# Patient Record
Sex: Female | Born: 1970 | Race: White | Hispanic: No | Marital: Married | Smoking: Former smoker
Health system: Southern US, Community
[De-identification: ages and names within clinical notes are randomized; demographics above are authoritative.]

## PROBLEM LIST (undated history)

## (undated) DIAGNOSIS — N952 Postmenopausal atrophic vaginitis: Secondary | ICD-10-CM

## (undated) HISTORY — PX: NO PAST SURGERIES: SHX2092

## (undated) HISTORY — DX: Postmenopausal atrophic vaginitis: N95.2

---

## 2019-08-21 ENCOUNTER — Other Ambulatory Visit: Payer: Self-pay

## 2019-08-21 ENCOUNTER — Other Ambulatory Visit (HOSPITAL_COMMUNITY)
Admission: RE | Admit: 2019-08-21 | Discharge: 2019-08-21 | Disposition: A | Discharge status: Home / Self Care | Payer: BC Managed Care – PPO | Source: Ambulatory Visit | Attending: Obstetrics and Gynecology | Admitting: Obstetrics and Gynecology

## 2019-08-21 ENCOUNTER — Encounter: Payer: Self-pay | Admitting: Obstetrics and Gynecology

## 2019-08-21 ENCOUNTER — Ambulatory Visit (INDEPENDENT_AMBULATORY_CARE_PROVIDER_SITE_OTHER): Payer: BC Managed Care – PPO | Admitting: Obstetrics and Gynecology

## 2019-08-21 VITALS — Ht 68.0 in | Wt 154.0 lb

## 2019-08-21 DIAGNOSIS — T148XXA Other injury of unspecified body region, initial encounter: Secondary | ICD-10-CM

## 2019-08-21 DIAGNOSIS — Z1212 Encounter for screening for malignant neoplasm of rectum: Secondary | ICD-10-CM

## 2019-08-21 DIAGNOSIS — Z130 Encounter for screening for diseases of the blood and blood-forming organs and certain disorders involving the immune mechanism: Secondary | ICD-10-CM

## 2019-08-21 DIAGNOSIS — Z9189 Other specified personal risk factors, not elsewhere classified: Secondary | ICD-10-CM | POA: Insufficient documentation

## 2019-08-21 DIAGNOSIS — Z803 Family history of malignant neoplasm of breast: Secondary | ICD-10-CM

## 2019-08-21 DIAGNOSIS — Z131 Encounter for screening for diabetes mellitus: Secondary | ICD-10-CM

## 2019-08-21 DIAGNOSIS — Z124 Encounter for screening for malignant neoplasm of cervix: Secondary | ICD-10-CM

## 2019-08-21 DIAGNOSIS — L900 Lichen sclerosus et atrophicus: Secondary | ICD-10-CM

## 2019-08-21 DIAGNOSIS — Z1231 Encounter for screening mammogram for malignant neoplasm of breast: Secondary | ICD-10-CM

## 2019-08-21 DIAGNOSIS — Z13220 Encounter for screening for lipoid disorders: Secondary | ICD-10-CM

## 2019-08-21 DIAGNOSIS — Z720 Tobacco use: Secondary | ICD-10-CM

## 2019-08-21 DIAGNOSIS — Z01419 Encounter for gynecological examination (general) (routine) without abnormal findings: Secondary | ICD-10-CM | POA: Diagnosis not present

## 2019-08-21 DIAGNOSIS — Z1211 Encounter for screening for malignant neoplasm of colon: Secondary | ICD-10-CM

## 2019-08-21 DIAGNOSIS — E28319 Asymptomatic premature menopause: Secondary | ICD-10-CM | POA: Insufficient documentation

## 2019-08-21 DIAGNOSIS — Z0000 Encounter for general adult medical examination without abnormal findings: Secondary | ICD-10-CM

## 2019-08-21 DIAGNOSIS — Z1329 Encounter for screening for other suspected endocrine disorder: Secondary | ICD-10-CM

## 2019-08-21 MED ORDER — CLOBETASOL PROPIONATE 0.05 % EX OINT: TOPICAL_OINTMENT | CUTANEOUS | 5 refills | Status: DC

## 2019-08-21 NOTE — Progress Notes (Signed)
Gynecology Annual Exam  PCP: Patient, No Pcp Per  Chief Complaint:  Chief Complaint  Patient presents with  . Gynecologic Exam    Vaginal irritation/itching    History of Present Illness:Patient is a 48 y.o. No obstetric history on file. presents for annual exam. The patient has no complaints today.   LMP: No LMP recorded. Patient is postmenopausal. Menarche:10 No periods since 2004 (33) Believes she went through menopause less than 43. Reports really hot flashes.  The patient is sexually active. She denies dyspareunia.  The patient does perform self breast exams.  There is notable family history of breast or ovarian cancer in her family.  The patient wears seatbelts: yes.   The patient has regular exercise: yes, uses a treadmill and eliptical.   The patient denies current symptoms of depression.     Reports she has had 3 fractures recently. One from kicking a chair. Two from leaning over her car middle [employer] and breaking her ribs.   She reports that 2-3 months ago she noticed that she lost pigmentation of her vulva. She has serious itching and irritation. She rubs the area ad sometimes scratches it until it bleeds.   Hx of 3 vaginal deliveries in 2003, 1990 and 1994.  No prior history of fibroids, polyps, or ovarian cysts. Denies a history of STIs. Her husband had a vasectomy.   She believes she has abnormal pap smears before with normal colposcopies.   Currently smoking 6-7 cigarettes a day. Has smoked since 12.    Review of Systems: Review of Systems  Constitutional: Negative for chills, fever, malaise/fatigue and weight loss.  HENT: Negative for congestion, hearing loss and sinus pain.   Eyes: Negative for blurred vision and double vision.  Respiratory: Negative for cough, sputum production, shortness of breath and wheezing.   Cardiovascular: Negative for chest pain, palpitations, orthopnea and leg swelling.  Gastrointestinal: Negative for abdominal pain,  constipation, diarrhea, nausea and vomiting.  Genitourinary: Negative for dysuria, flank pain, frequency, hematuria and urgency.  Musculoskeletal: Negative for back pain, falls and joint pain.  Skin: Positive for itching. Negative for rash.  Neurological: Negative for dizziness and headaches.  Psychiatric/Behavioral: Negative for depression, substance abuse and suicidal ideas. The patient is not nervous/anxious.     Past Medical History:  History reviewed. No pertinent past medical history.  Past Surgical History:  History reviewed. No pertinent surgical history.  Gynecologic History:  No LMP recorded. Patient is postmenopausal. Last Pap: Results were: more than 10 years ago  Last mammogram: in 30's, unknown result  Obstetric History: No obstetric history on file.  Family History:  History reviewed. No pertinent family history.  Social History:  Social History   Socioeconomic History  . Marital status: Married    Spouse name: Not on file  . Number of children: Not on file  . Years of education: Not on file  . Highest education level: Not on file  Occupational History  . Not on file  Social Needs  . Financial resource strain: Not on file  . Food insecurity    Worry: Not on file    Inability: Not on file  . Transportation needs    Medical: Not on file    Non-medical: Not on file  Tobacco Use  . Smoking status: Current Every Day Smoker  . Smokeless tobacco: Never Used  Substance and Sexual Activity  . Alcohol use: Not Currently  . Drug use: Never  . Sexual activity: Yes  Birth control/protection: Post-menopausal  Lifestyle  . Physical activity    Days per week: Not on file    Minutes per session: Not on file  . Stress: Not on file  Relationships  . Social Herbalist on phone: Not on file    Gets together: Not on file    Attends religious service: Not on file    Active member of club or organization: Not on file    Attends meetings of clubs or  organizations: Not on file    Relationship status: Not on file  . Intimate partner violence    Fear of current or ex partner: Not on file    Emotionally abused: Not on file    Physically abused: Not on file    Forced sexual activity: Not on file  Other Topics Concern  . Not on file  Social History Narrative  . Not on file    Allergies:  No Known Allergies  Medications: Prior to Admission medications   Medication Sig Start Date End Date Taking? Authorizing Provider  clobetasol ointment (TEMOVATE) 0.05 % Apply to affected area every night for 4 weeks, then every other day for 4 weeks and then twice a week for 4 weeks or until resolution. 08/21/19   Homero Fellers, MD    Physical Exam Vitals: Height 5\' 8"  (1.727 m), weight 154 lb (69.9 kg).  General: NAD HEENT: normocephalic, anicteric Thyroid: no enlargement, no palpable nodules Pulmonary: No increased work of breathing, CTAB Cardiovascular: RRR, distal pulses 2+ Breast: Breast symmetrical, no tenderness, no palpable nodules or masses, no skin or nipple retraction present, no nipple discharge.  No axillary or supraclavicular lymphadenopathy. Abdomen: NABS, soft, non-tender, non-distended.  Umbilicus without lesions.  No hepatomegaly, splenomegaly or masses palpable. No evidence of hernia  Genitourinary:  External: Normal external female genitalia.  Normal urethral meatus, normal Bartholin's and Skene's glands.   LOSS OF PIGMENTATION- extending from the clitoral hood to around the anus. Excoriations present.   Vagina: Normal vaginal mucosa, no evidence of prolapse.    Cervix: Grossly normal in appearance, no bleeding  Uterus: Non-enlarged, mobile, normal contour.  No CMT  Adnexa: ovaries non-enlarged, no adnexal masses  Rectal: deferred  Lymphatic: no evidence of inguinal lymphadenopathy Extremities: no edema, erythema, or tenderness Neurologic: Grossly intact Psychiatric: mood appropriate, affect full  Physical Exam   Genitourinary:       Genitourinary Comments: Extensive loss of pigmentation in outlined area. Excoriations present    Female chaperone present for pelvic and breast  portions of the physical exam  VULVAR BIOPSY NOTE The indications for vulvar biopsy (rule out neoplasia, establish lichen sclerosus diagnosis) were reviewed.   Risks of the biopsy including pain, bleeding, infection, inadequate specimen, scarring and need for additional procedures  were discussed. The patient stated understanding and agreed to undergo procedure today. Consent was signed,  time out performed.   The patient's vulva was prepped with Betadine. 1% lidocaine was injected into area of concern. A 3 -mm punch biopsy was done, biopsy tissue was picked up with sterile forceps and sterile scissors were used to excise the lesion.  Small bleeding was noted and hemostasis was achieved using silver nitrate sticks.  The patient tolerated the procedure well. Post-procedure instructions  (pelvic rest for one week) were given to the patient. The patient is to call with heavy bleeding, fever greater than 100.4, foul smelling vaginal discharge or other concerns.     Assessment: 48 y.o. No obstetric history on  file. routine annual exam  Plan: Problem List Items Addressed This Visit    None    Visit Diagnoses    Health care maintenance    -  Primary   Cervical cancer screening       Relevant Orders   Cytology - PAP   Encounter for colorectal cancer screening       Relevant Orders   Cologuard   Breast cancer screening by mammogram       Relevant Orders   Mammogram Screening Routine   Lichen sclerosus       Relevant Medications   clobetasol ointment (TEMOVATE) 0.05 %   Other Relevant Orders   Surgical pathology   Fracture of bone       Relevant Orders   DG Bone Density   Screening cholesterol level       Relevant Orders   Lipid panel   Thyroid disorder screen       Relevant Orders   TSH + free T4   Screening for iron  deficiency anemia       Relevant Orders   CBC   Screening for diabetes mellitus       Relevant Orders   Comprehensive metabolic panel      1) Mammogram - recommend yearly screening mammogram.  Mammogram Was ordered today  2) STI screening  was not offered and therefore not obtained  3) ASCCP guidelines and rational discussed.  Patient opts for every 5 years screening interval  4) Osteoporosis  - per USPTF routine screening DEXA at age 50 - FRAX 10 year major fracture risk 21,  10 year hip fracture risk 3.2  YES: maternal hip fracture(x2), previous fragility fracture(x3), smoker, premature menopause  Consider FDA-approved medical therapies in postmenopausal women and men aged 50 years and older, based on the following: a) A hip or vertebral (clinical or morphometric) fracture b) T-score ? -2.5 at the femoral neck or spine after appropriate evaluation to exclude secondary causes C) Low bone mass (T-score between -1.0 and -2.5 at the femoral neck or spine) and a 10-year probability of a hip fracture ? 3% or a 10-year probability of a major osteoporosis-related fracture ? 20% based on the [place]-adapted WHO algorithm   Referred for DEXA scan based of of this result of a fragility fracture . Reviewed Vitamin D and calcium supplementation.   5) Routine healthcare maintenance including cholesterol, diabetes screening discussed To return fasting at a later date  6) Colonoscopy declined, Cologuard accepted..  Screening recommended starting at age 50 for average risk individuals, age 50 for individuals deemed at increased risk (including African Americans) and recommended to continue until age 50.  For patient age 50-85 individualized approach is recommended.  Gold standard screening is via colonoscopy, Cologuard screening is an acceptable alternative for patient unwilling or unable to undergo colonoscopy.  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 Biopsy performed to verify this diagnosis. Will treat with clobetasol and follow up in 6 weeks. Discussed soak and seal treatments twice a day and antihistamine before bed time. Discussed itch scratch cycle and encourage patient to stop vulvar itching.   9) Return in about 6 weeks (around 10/02/2019) for fasting labs ASAP, 6 weeks GYN visit.   Fragility fractures- dexa scan ordered    Jerene Pitch MD [facility], [facility] 08/21/2019 5:24 PM

## 2019-08-21 NOTE — Patient Instructions (Signed)
Institute of [place] for Calcium and Vitamin D  Age (yr) Calcium Recommended Dietary Allowance (mg/day) Vitamin D Recommended Dietary Allowance (international units/day)  9-18 1,300 600  19-50 1,000 600  51-70 1,200 600  71 and older 1,200 800  Data from Institute of Medicine. Dietary reference intakes: calcium, vitamin D. [place], [place]: [employer]; 2011.     Bone Health Bones protect organs, store calcium, anchor muscles, and support the whole body. Keeping your bones strong is important, especially as you get older. You can take actions to help keep your bones strong and healthy. Why is keeping my bones healthy important?  Keeping your bones healthy is important because your body constantly replaces bone cells. Cells get old, and new cells take their place. As we age, we lose bone cells because the body may not be able to make enough new cells to replace the old cells. The amount of bone cells and bone tissue you have is referred to as bone mass. The higher your bone mass, the stronger your bones. The aging process leads to an overall loss of bone mass in the body, which can increase the likelihood of:  Joint pain and stiffness.  Broken bones.  A condition in which the bones become weak and brittle (osteoporosis). A large decline in bone mass occurs in older adults. In women, it occurs about the time of menopause. What actions can I take to keep my bones healthy? Good health habits are important for maintaining healthy bones. This includes eating nutritious foods and exercising regularly. To have healthy bones, you need to get enough of the right minerals and vitamins. Most nutrition experts recommend getting these nutrients from the foods that you eat. In some cases, taking supplements may also be recommended. Doing certain types of exercise is also important for bone health. What are the nutritional recommendations for healthy  bones?  Eating a well-balanced diet with plenty of calcium and vitamin D will help to protect your bones. Nutritional recommendations vary from person to person. Ask your health care provider what is healthy for you. Here are some general guidelines. Get enough calcium Calcium is the most important (essential) mineral for bone health. Most people can get enough calcium from their diet, but supplements may be recommended for people who are at risk for osteoporosis. Good sources of calcium include:  Dairy products, such as low-fat or nonfat milk, cheese, and yogurt.  Dark green leafy vegetables, such as bok choy and broccoli.  Calcium-fortified foods, such as orange juice, cereal, bread, soy beverages, and tofu products.  Nuts, such as almonds. Follow these recommended amounts for daily calcium intake:  Children, age 14-3: 700 mg.  Children, age 30-8: 1,000 mg.  Children, age 53-13: 1,300 mg.  Teens, age 63-18: 1,300 mg.  Adults, age 20-50: 1,000 mg.  Adults, age 61-70: ? Men: 1,000 mg. ? Women: 1,200 mg.  Adults, age 23 or older: 1,200 mg.  Pregnant and breastfeeding females: ? Teens: 1,300 mg. ? Adults: 1,000 mg. Get enough vitamin D Vitamin D is the most essential vitamin for bone health. It helps the body absorb calcium. Sunlight stimulates the skin to make vitamin D, so be sure to get enough sunlight. If you live in a cold climate or you do not get outside often, your health care provider may recommend that you take vitamin D supplements. Good sources of vitamin D in your diet include:  Egg yolks.  Saltwater fish.  Milk and cereal fortified with  vitamin D. Follow these recommended amounts for daily vitamin D intake:  Children and teens, age 50-18: 600 international units.  Adults, age 50 or younger: 400-800 international units.  Adults, age 50 or older: 800-1,000 international units. Get other important nutrients Other nutrients that are important for bone health  include:  Phosphorus. This mineral is found in meat, poultry, dairy foods, nuts, and legumes. The recommended daily intake for adult men and adult women is 700 mg.  Magnesium. This mineral is found in seeds, nuts, dark green vegetables, and legumes. The recommended daily intake for adult men is 400-420 mg. For adult women, it is 310-320 mg.  Vitamin K. This vitamin is found in green leafy vegetables. The recommended daily intake is 120 mg for adult men and 90 mg for adult women. What type of physical activity is best for building and maintaining healthy bones? Weight-bearing and strength-building activities are important for building and maintaining healthy bones. Weight-bearing activities cause muscles and bones to work against gravity. Strength-building activities increase the strength of the muscles that support bones. Weight-bearing and muscle-building activities include:  Walking and hiking.  Jogging and running.  Dancing.  Gym exercises.  Lifting weights.  Tennis and racquetball.  Climbing stairs.  Aerobics. Adults should get at least 30 minutes of moderate physical activity on most days. Children should get at least 60 minutes of moderate physical activity on most days. Ask your health care provider what type of exercise is best for you. How can I find out if my bone mass is low? Bone mass can be measured with an X-ray test called a bone mineral density (BMD) test. This test is recommended for all women who are age 50 or older. It may also be recommended for:  Men who are age 50 or older.  People who are at risk for osteoporosis because of: ? Having bones that break easily. ? Having a long-term disease that weakens bones, such as kidney disease or rheumatoid arthritis. ? Having menopause earlier than normal. ? Taking medicine that weakens bones, such as steroids, thyroid hormones, or hormone treatment for breast cancer or prostate cancer. ? Smoking. ? Drinking three or  more alcoholic drinks a day. If you find that you have a low bone mass, you may be able to prevent osteoporosis or further bone loss by changing your diet and lifestyle. Where can I find more information? For more information, check out the following websites:  National Osteoporosis Foundation: https://carlson-fletcher.info/www.nof.org/patients  [employer] Institutes of Health: www.bones.http://www.myers.net/nih.gov  International Osteoporosis Foundation: Investment banker, operationalwww.iofbonehealth.org Summary  The aging process leads to an overall loss of bone mass in the body, which can increase the likelihood of broken bones and osteoporosis.  Eating a well-balanced diet with plenty of calcium and vitamin D will help to protect your bones.  Weight-bearing and strength-building activities are also important for building and maintaining strong bones.  Bone mass can be measured with an X-ray test called a bone mineral density (BMD) test. This information is not intended to replace advice given to you by your health care provider. Make sure you discuss any questions you have with your health care provider. Document Released: 11/20/2003 Document Revised: 09/26/2017 Document Reviewed: 09/26/2017 Elsevier Patient Education  2020 [employer]    Steps to Quit Smoking Smoking tobacco is the leading cause of preventable death. It can affect almost every organ in the body. Smoking puts you and those around you at risk for developing many serious chronic diseases. Quitting smoking can be difficult, but  it is one of the best things that you can do for your health. It is never too late to quit. How do I get ready to quit? When you decide to quit smoking, create a plan to help you succeed. Before you quit:  Pick a date to quit. Set a date within the next 2 weeks to give you time to prepare.  Write down the reasons why you are quitting. Keep this list in places where you will see it often.  Tell your family, friends, and co-workers that you are quitting. Support from  your loved ones can make quitting easier.  Talk with your health care provider about your options for quitting smoking.  Find out what treatment options are covered by your health insurance.  Identify people, places, things, and activities that make you want to smoke (triggers). Avoid them. What first steps can I take to quit smoking?  Throw away all cigarettes at home, at work, and in your car.  Throw away smoking accessories, such as Scientist, research (medical).  Clean your car. Make sure to empty the ashtray.  Clean your home, including curtains and carpets. What strategies can I use to quit smoking? Talk with your health care provider about combining strategies, such as taking medicines while you are also receiving in-person counseling. Using these two strategies together makes you more likely to succeed in quitting than if you used either strategy on its own.  If you are pregnant or breastfeeding, talk with your health care provider about finding counseling or other support strategies to quit smoking. Do not take medicine to help you quit smoking unless your health care provider tells you to do so. To quit smoking: Quit right away  Quit smoking completely, instead of gradually reducing how much you smoke over a period of time. Research shows that stopping smoking right away is more successful than gradually quitting.  Attend in-person counseling to help you build problem-solving skills. You are more likely to succeed in quitting if you attend counseling sessions regularly. Even short sessions of 10 minutes can be effective. Take medicine You may take medicines to help you quit smoking. Some medicines require a prescription and some you can purchase over-the-counter. Medicines may have nicotine in them to replace the nicotine in cigarettes. Medicines may:  Help to stop cravings.  Help to relieve withdrawal symptoms. Your health care provider may recommend:  Nicotine patches, gum, or  lozenges.  Nicotine inhalers or sprays.  Non-nicotine medicine that is taken by mouth. Find resources Find resources and support systems that can help you to quit smoking and remain smoke-free after you quit. These resources are most helpful when you use them often. They include:  Online chats with a Social worker.  Telephone quitlines.  Printed Furniture conservator/restorer.  Support groups or group counseling.  Text messaging programs.  Mobile phone apps or applications. Use apps that can help you stick to your quit plan by providing reminders, tips, and encouragement. There are many free apps for mobile devices as well as websites. Examples include Quit Guide from the [employer] and smokefree.gov What things can I do to make it easier to quit?   Reach out to your family and friends for support and encouragement. Call telephone quitlines (1-800-QUIT-NOW), reach out to support groups, or work with a counselor for support.  Ask people who smoke to avoid smoking around you.  Avoid places that trigger you to smoke, such as bars, parties, or smoke-break areas at work.  Spend time with  people who do not smoke.  Lessen the stress in your life. Stress can be a smoking trigger for some people. To lessen stress, try: ? Exercising regularly. ? Doing deep-breathing exercises. ? Doing yoga. ? Meditating. ? Performing a body scan. This involves closing your eyes, scanning your body from head to toe, and noticing which parts of your body are particularly tense. Try to relax the muscles in those areas. How will I feel when I quit smoking? Day 1 to 3 weeks Within the first 24 hours of quitting smoking, you may start to feel withdrawal symptoms. These symptoms are usually most noticeable 2-3 days after quitting, but they usually do not last for more than 2-3 weeks. You may experience these symptoms:  Mood swings.  Restlessness, anxiety, or irritability.  Trouble concentrating.  Dizziness.  Strong cravings  for sugary foods and nicotine.  Mild weight gain.  Constipation.  Nausea.  Coughing or a sore throat.  Changes in how the medicines that you take for unrelated issues work in your body.  Depression.  Trouble sleeping (insomnia). Week 3 and afterward After the first 2-3 weeks of quitting, you may start to notice more positive results, such as:  Improved sense of smell and taste.  Decreased coughing and sore throat.  Slower heart rate.  Lower blood pressure.  Clearer skin.  The ability to breathe more easily.  Fewer sick days. Quitting smoking can be very challenging. Do not get discouraged if you are not successful the first time. Some people need to make many attempts to quit before they achieve long-term success. Do your best to stick to your quit plan, and talk with your health care provider if you have any questions or concerns. Summary  Smoking tobacco is the leading cause of preventable death. Quitting smoking is one of the best things that you can do for your health.  When you decide to quit smoking, create a plan to help you succeed.  Quit smoking right away, not slowly over a period of time.  When you start quitting, seek help from your health care provider, family, or friends. This information is not intended to replace advice given to you by your health care provider. Make sure you discuss any questions you have with your health care provider. Document Released: 08/24/2001 Document Revised: 11/17/2018 Document Reviewed: 11/18/2018 Elsevier Patient Education  2020 [employer].

## 2019-08-22 ENCOUNTER — Other Ambulatory Visit: Payer: Self-pay | Admitting: Obstetrics and Gynecology

## 2019-08-22 ENCOUNTER — Encounter: Payer: Self-pay | Admitting: Obstetrics and Gynecology

## 2019-08-22 DIAGNOSIS — N9089 Other specified noninflammatory disorders of vulva and perineum: Secondary | ICD-10-CM

## 2019-08-22 MED ORDER — TRAMADOL HCL 50 MG PO TABS: 50.0000 mg | ORAL_TABLET | Freq: Four times a day (QID) | ORAL | 0 refills | Status: DC | PRN

## 2019-08-22 NOTE — Progress Notes (Unsigned)
tra

## 2019-08-22 NOTE — Telephone Encounter (Signed)
You can write her a note to be off of work for 1 week maximum. I can sen rx for tramadol Please notify the patient

## 2019-08-23 LAB — CYTOLOGY - PAP
Comment: NEGATIVE
Diagnosis: NEGATIVE
High risk HPV: NEGATIVE

## 2019-08-24 ENCOUNTER — Telehealth: Payer: Self-pay | Admitting: Obstetrics and Gynecology

## 2019-08-24 LAB — SURGICAL PATHOLOGY

## 2019-08-24 NOTE — Telephone Encounter (Signed)
Called and left voicemail about schedule change for 10/03/19. Dr Gilman Schmidt is now gong to be in surgery reschedule appointment to Thursday, 10/04/19.

## 2019-08-29 ENCOUNTER — Other Ambulatory Visit: Payer: BC Managed Care – PPO

## 2019-08-30 NOTE — Telephone Encounter (Signed)
Please schedule patient for a visit tomorrow

## 2019-09-03 ENCOUNTER — Ambulatory Visit: Payer: BC Managed Care – PPO | Admitting: Obstetrics and Gynecology

## 2019-09-27 LAB — COLOGUARD

## 2019-10-03 ENCOUNTER — Ambulatory Visit: Payer: BC Managed Care – PPO | Admitting: Obstetrics and Gynecology

## 2019-10-04 ENCOUNTER — Ambulatory Visit: Payer: BC Managed Care – PPO | Admitting: Obstetrics and Gynecology

## 2019-10-18 NOTE — Telephone Encounter (Signed)
Please call cologuard and request the report.

## 2020-10-30 ENCOUNTER — Emergency Department: Payer: BC Managed Care – PPO

## 2020-10-30 ENCOUNTER — Emergency Department
Admission: EM | Admit: 2020-10-30 | Discharge: 2020-10-30 | Disposition: A | Discharge status: Home / Self Care | Payer: BC Managed Care – PPO | Attending: Emergency Medicine | Admitting: Emergency Medicine

## 2020-10-30 ENCOUNTER — Other Ambulatory Visit: Payer: Self-pay

## 2020-10-30 DIAGNOSIS — F17210 Nicotine dependence, cigarettes, uncomplicated: Secondary | ICD-10-CM | POA: Insufficient documentation

## 2020-10-30 DIAGNOSIS — R52 Pain, unspecified: Secondary | ICD-10-CM

## 2020-10-30 DIAGNOSIS — D72829 Elevated white blood cell count, unspecified: Secondary | ICD-10-CM | POA: Diagnosis present

## 2020-10-30 DIAGNOSIS — Z20822 Contact with and (suspected) exposure to covid-19: Secondary | ICD-10-CM | POA: Insufficient documentation

## 2020-10-30 DIAGNOSIS — M791 Myalgia, unspecified site: Secondary | ICD-10-CM | POA: Diagnosis not present

## 2020-10-30 DIAGNOSIS — R509 Fever, unspecified: Secondary | ICD-10-CM | POA: Insufficient documentation

## 2020-10-30 DIAGNOSIS — R519 Headache, unspecified: Secondary | ICD-10-CM | POA: Diagnosis not present

## 2020-10-30 LAB — CBC WITH DIFFERENTIAL/PLATELET
Abs Immature Granulocytes: 0.08 10*3/uL — ABNORMAL HIGH (ref 0.00–0.07)
Basophils Absolute: 0.1 10*3/uL (ref 0.0–0.1)
Basophils Relative: 0 %
Eosinophils Absolute: 0.1 10*3/uL (ref 0.0–0.5)
Eosinophils Relative: 1 %
HCT: 40.7 % (ref 36.0–46.0)
Hemoglobin: 13.9 g/dL (ref 12.0–15.0)
Immature Granulocytes: 1 %
Lymphocytes Relative: 18 %
Lymphs Abs: 2.9 10*3/uL (ref 0.7–4.0)
MCH: 30.1 pg (ref 26.0–34.0)
MCHC: 34.2 g/dL (ref 30.0–36.0)
MCV: 88.1 fL (ref 80.0–100.0)
Monocytes Absolute: 1.4 10*3/uL — ABNORMAL HIGH (ref 0.1–1.0)
Monocytes Relative: 9 %
Neutro Abs: 11 10*3/uL — ABNORMAL HIGH (ref 1.7–7.7)
Neutrophils Relative %: 71 %
Platelets: 271 10*3/uL (ref 150–400)
RBC: 4.62 MIL/uL (ref 3.87–5.11)
RDW: 12.6 % (ref 11.5–15.5)
WBC: 15.6 10*3/uL — ABNORMAL HIGH (ref 4.0–10.5)
nRBC: 0 % (ref 0.0–0.2)

## 2020-10-30 LAB — URINALYSIS, COMPLETE (UACMP) WITH MICROSCOPIC
Bilirubin Urine: NEGATIVE
Glucose, UA: NEGATIVE mg/dL
Ketones, ur: 5 mg/dL — AB
Leukocytes,Ua: NEGATIVE
Nitrite: NEGATIVE
Protein, ur: NEGATIVE mg/dL
Specific Gravity, Urine: 1.005 (ref 1.005–1.030)
pH: 5 (ref 5.0–8.0)

## 2020-10-30 LAB — COMPREHENSIVE METABOLIC PANEL
ALT: 19 U/L (ref 0–44)
AST: 21 U/L (ref 15–41)
Albumin: 4.4 g/dL (ref 3.5–5.0)
Alkaline Phosphatase: 94 U/L (ref 38–126)
Anion gap: 11 (ref 5–15)
BUN: 11 mg/dL (ref 6–20)
CO2: 23 mmol/L (ref 22–32)
Calcium: 9.3 mg/dL (ref 8.9–10.3)
Chloride: 101 mmol/L (ref 98–111)
Creatinine, Ser: 0.9 mg/dL (ref 0.44–1.00)
GFR, Estimated: >60 mL/min (ref >60)
Glucose, Bld: 114 mg/dL — ABNORMAL HIGH (ref 70–99)
Potassium: 3.6 mmol/L (ref 3.5–5.1)
Sodium: 135 mmol/L (ref 135–145)
Total Bilirubin: 2.2 mg/dL — ABNORMAL HIGH (ref 0.3–1.2)
Total Protein: 7.8 g/dL (ref 6.5–8.1)

## 2020-10-30 LAB — RESP PANEL BY RT-PCR (FLU A&B, COVID) ARPGX2
Influenza A by PCR: NEGATIVE
Influenza B by PCR: NEGATIVE
SARS Coronavirus 2 by RT PCR: NEGATIVE

## 2020-10-30 LAB — PROTIME-INR
INR: 1 (ref 0.8–1.2)
Prothrombin Time: 13.2 seconds (ref 11.4–15.2)

## 2020-10-30 LAB — LACTIC ACID, PLASMA: Lactic Acid, Venous: 0.8 mmol/L (ref 0.5–1.9)

## 2020-10-30 NOTE — Discharge Instructions (Addendum)
Please seek medical attention for any high fevers, chest pain, shortness of breath, change in behavior, persistent vomiting, bloody stool or any other new or concerning symptoms.  

## 2020-10-30 NOTE — ED Provider Notes (Signed)
[facility] Emergency Department Provider Note   ____________________________________________   I have reviewed the triage vital signs and the nursing notes.   HISTORY  Chief Complaint Weakness, Fatigue, and Headache   History limited by: Not Limited   HPI Michelle Paul is a 50 y.o. female who presents to the emergency department today from [place] clinic because of concerns for elevated white count and blood work.  Patient went to [facility] clinic today because of concerns for fever headache and myalgias.  Patient did take some over-the-counter medication which has helped with her fever and she states her headache feels better.  Blood work was checked to [facility] clinic and she was found to have a white count of 15.  Because of this she was sent to the emergency department.   Records reviewed. Per medical record review patient has a history of leukocytosis on blood work earlier today.  History reviewed. No pertinent past medical history.  Patient Active Problem List   Diagnosis Date Noted  . Tobacco use 08/21/2019  . High risk for hip fracture 08/21/2019  . Premature menopause 08/21/2019  . Family history of breast cancer 08/21/2019    History reviewed. No pertinent surgical history.  Prior to Admission medications   Medication Sig Start Date End Date Taking? Authorizing Provider  clobetasol ointment (TEMOVATE) 0.05 % Apply to affected area every night for 4 weeks, then every other day for 4 weeks and then twice a week for 4 weeks or until resolution. 08/21/19   Schuman, Jaquelyn Bitter, MD  traMADol (ULTRAM) 50 MG tablet Take 1 tablet (50 mg total) by mouth every 6 (six) hours as needed. 08/22/19   Natale Milch, MD    Allergies Patient has no known allergies.  Family History  Problem Relation Age of Onset  . Breast cancer Mother 66  . Kidney cancer Sister 26    Social History Social History   Tobacco Use  . Smoking status: Current  Every Day Smoker    Packs/day: 0.33    Years: 36.00    Pack years: 11.88    Types: Cigarettes    Start date: 24  . Smokeless tobacco: Never Used  Vaping Use  . Vaping Use: Never used  Substance Use Topics  . Alcohol use: Not Currently  . Drug use: Never    Review of Systems Constitutional: Positive for fever.  Eyes: No visual changes. ENT: No sore throat. Cardiovascular: Denies chest pain. Respiratory: Denies shortness of breath. Gastrointestinal: No abdominal pain.  No nausea, no vomiting.  No diarrhea.   Genitourinary: Negative for dysuria. Musculoskeletal: Positive for myalgias.  Skin: Negative for rash. Neurological: Positive for headache. ____________________________________________   PHYSICAL EXAM:  VITAL SIGNS: ED Triage Vitals  Enc Vitals Group     BP 10/30/20 1851 107/67     Pulse Rate 10/30/20 1851 81     Resp 10/30/20 1851 18     Temp 10/30/20 1851 98.8 F (37.1 C)     Temp Source 10/30/20 1851 Oral     SpO2 10/30/20 1851 96 %     Weight 10/30/20 1851 157 lb (71.2 kg)     Height 10/30/20 1851 5\' 8"  (1.727 m)     Head Circumference --      Peak Flow --      Pain Score 10/30/20 1900 4   Constitutional: Alert and oriented.  Eyes: Conjunctivae are normal.  ENT      Head: Normocephalic and atraumatic.  Nose: No congestion/rhinnorhea.      Mouth/Throat: Mucous membranes are moist.      Neck: No stridor. Hematological/Lymphatic/Immunilogical: No cervical lymphadenopathy. Cardiovascular: Normal rate, regular rhythm.  No murmurs, rubs, or gallops.  Respiratory: Normal respiratory effort without tachypnea nor retractions. Breath sounds are clear and equal bilaterally. No wheezes/rales/rhonchi. Gastrointestinal: Soft and non tender. No rebound. No guarding.  Genitourinary: Deferred Musculoskeletal: Normal range of motion in all extremities. No lower extremity edema. Neurologic:  Normal speech and language. No gross focal neurologic deficits are  appreciated.  Skin:  Skin is warm, dry and intact. No rash noted. Psychiatric: Mood and affect are normal. Speech and behavior are normal. Patient exhibits appropriate insight and judgment.  ____________________________________________    LABS (pertinent positives/negatives)  Lactic acid 0.8 CBC wbc 15.6, hgb 13.9, plt 271 CMP wnl except glu 114, t bili 2.2  ____________________________________________   EKG  None  ____________________________________________    RADIOLOGY  CXR Apical scarring otherwise clear lungs  ____________________________________________   PROCEDURES  Procedures  ____________________________________________   INITIAL IMPRESSION / ASSESSMENT AND PLAN / ED COURSE  Pertinent labs & imaging results that were available during my care of the patient were reviewed by me and considered in my medical decision making (see chart for details).   Patient presented to the emergency department today from [facility] clinic because of concerns for elevated white count on blood work.  Patient does state that she had myalgias headache and fever earlier today.  I would certainly have concerns for possible viral illness.  At this time given the patient felt some relief with over-the-counter medications do not feel any further medications are required at this time.  Also discussed with patient that given likely viral illness I do not think antibiotics are warranted at this time.  Will send off COVID.  ____________________________________________   FINAL CLINICAL IMPRESSION(S) / ED DIAGNOSES  Final diagnoses:  Nonintractable headache, unspecified chronicity pattern, unspecified headache type  Fever, unspecified fever cause  Body aches     Note: This dictation was prepared with Dragon dictation. Any transcriptional errors that result from this process are unintentional     Phineas Semen, MD 10/30/20 2242

## 2020-10-30 NOTE — ED Triage Notes (Signed)
Pt states she woke up this am with a HA. Pt states she had fever and chills this afternoon, temp of 101.6. Pt took 600 mg ibuprofen at 1530. Pt temp 98.8 at this time. Pt states she started feeling weak and fatigued today. Pt states she had stomach ache yesterday and now is tender to touch in suprapubic area.

## 2020-10-30 NOTE — ED Notes (Signed)
Pt c/o fever, chills, HA, fatigue

## 2020-10-30 NOTE — ED Notes (Signed)
MD aware only 1 set of blood cultures collected

## 2020-11-01 LAB — URINE CULTURE: Culture: 10000 — AB

## 2020-11-04 LAB — CULTURE, BLOOD (ROUTINE X 2): Culture: NO GROWTH

## 2021-10-02 ENCOUNTER — Ambulatory Visit: Payer: BC Managed Care – PPO | Admitting: Obstetrics and Gynecology

## 2022-09-27 IMAGING — CR DG CHEST 2V
2 series · 2 of 2 positions shown · non-contrast
Comparison: None.

CLINICAL DATA: Fever and chills

EXAM:
CHEST - 2 VIEW

[chest pa]
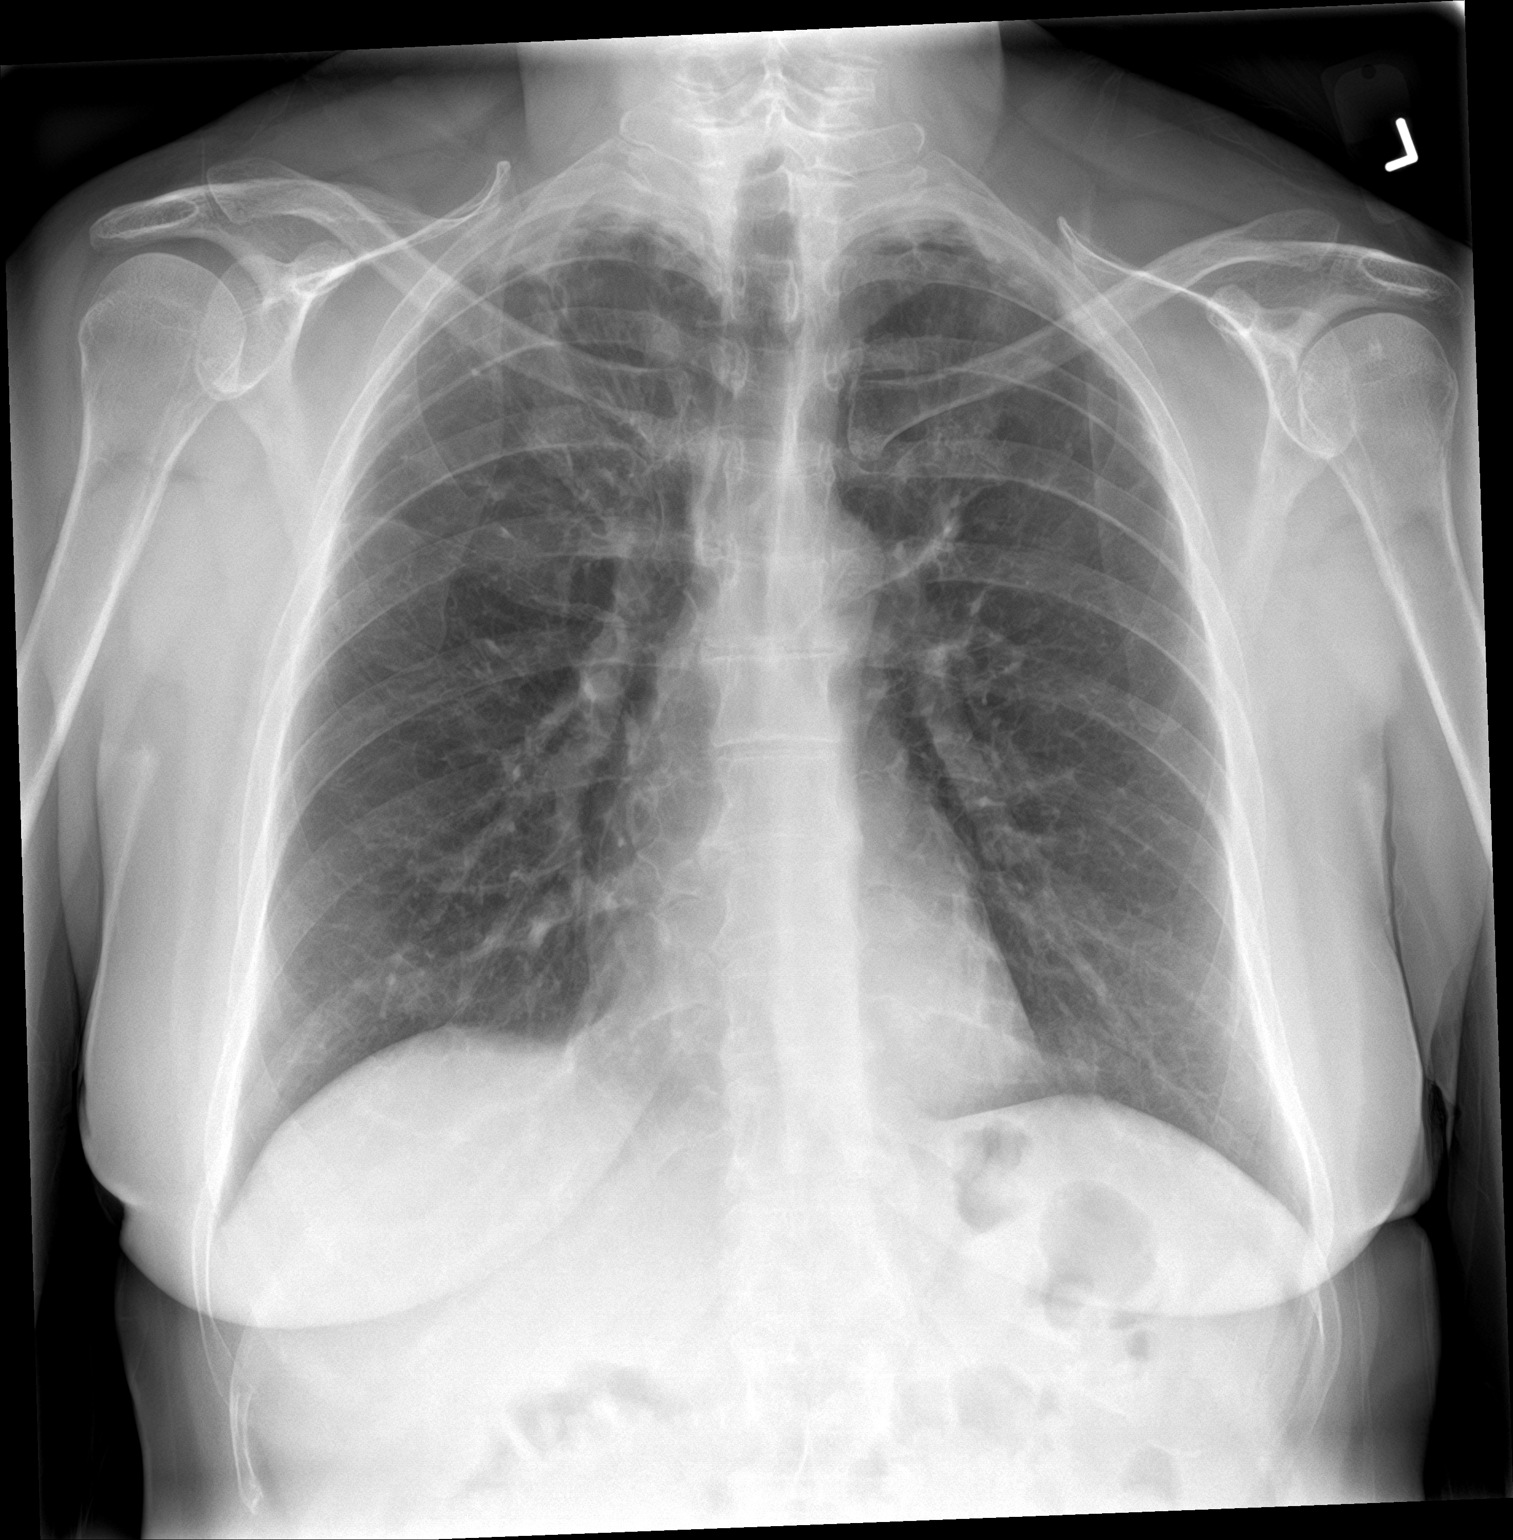

[chest lat]
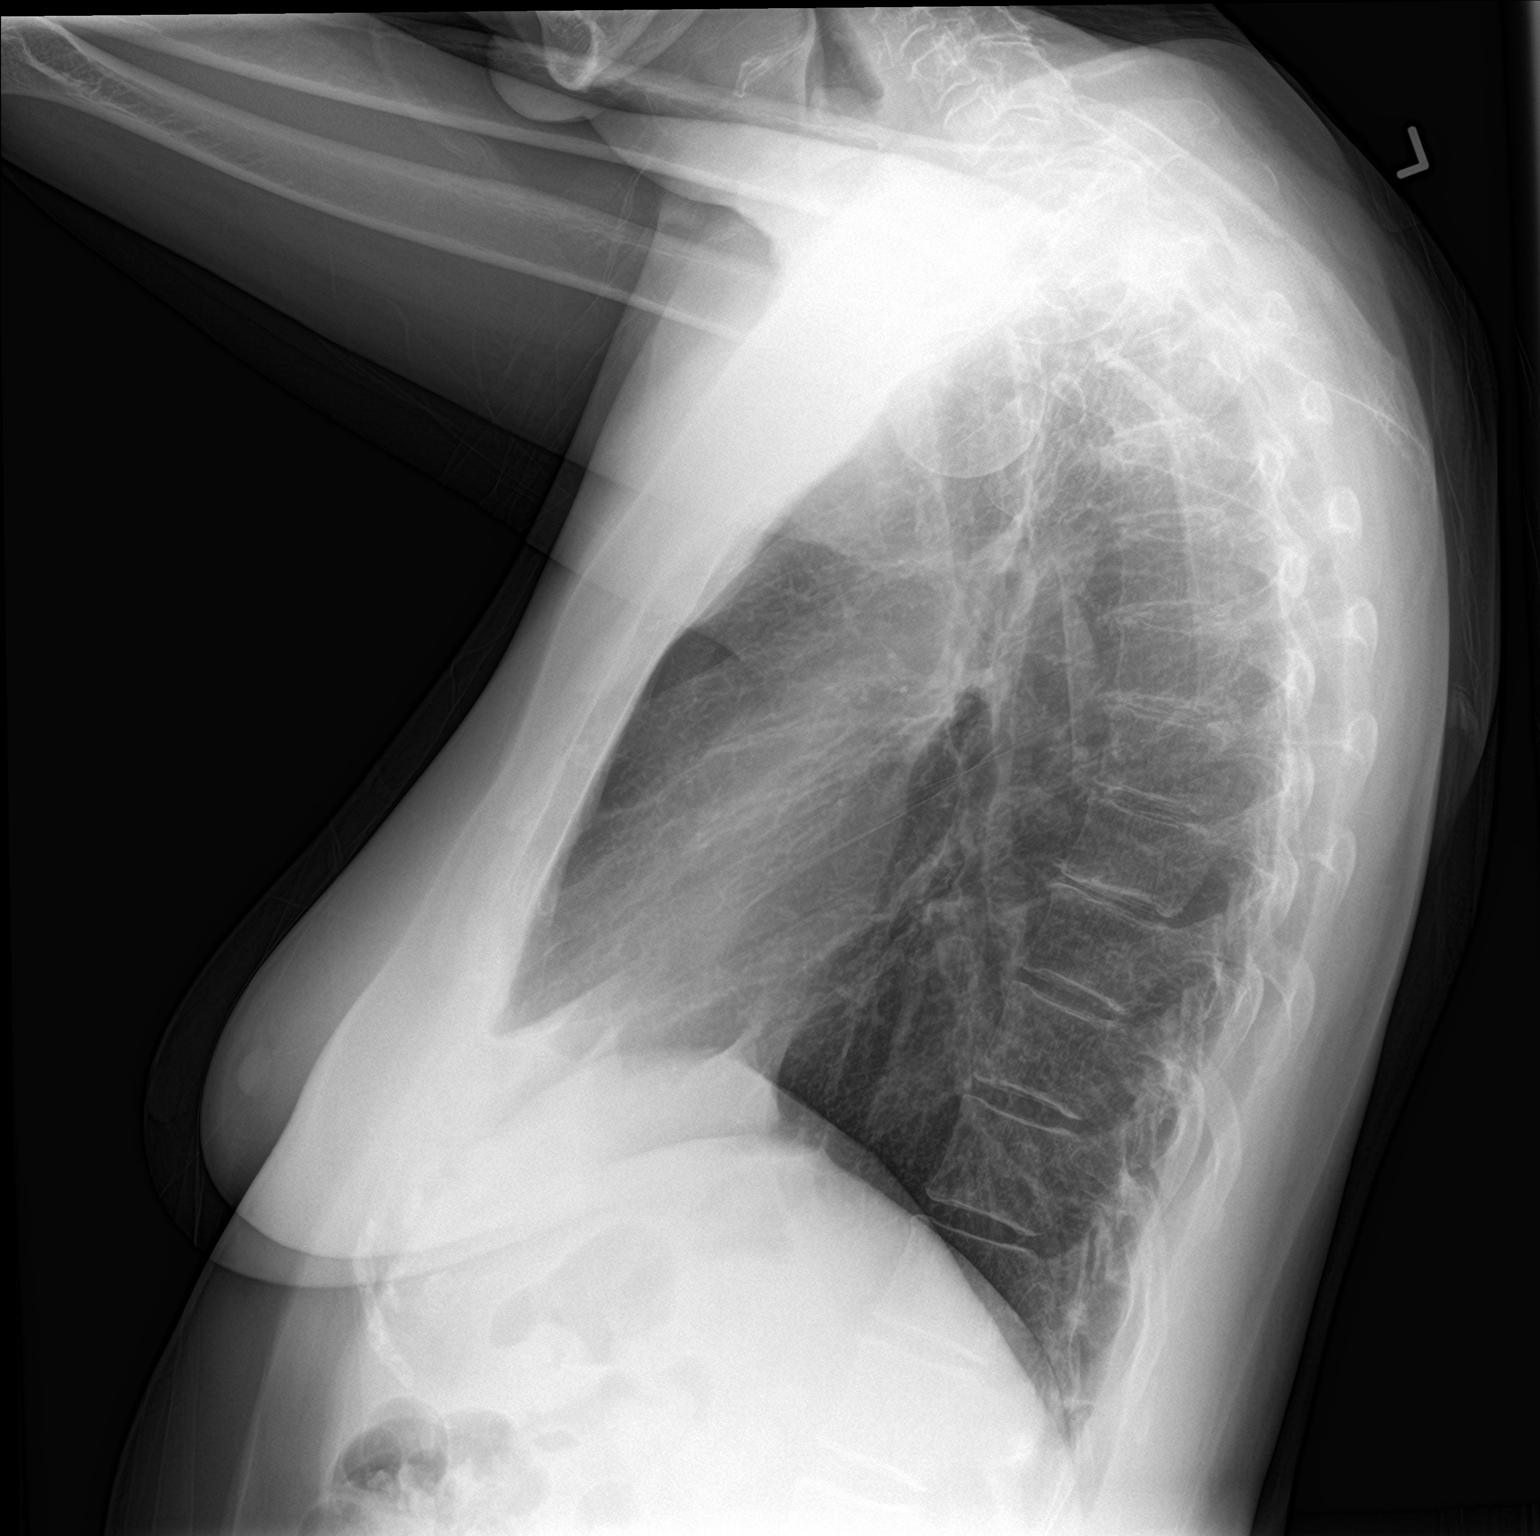

[2 of 2 positions shown; findings below may reference images not displayed]

FINDINGS: There is scarring in the apices. There is no edema or airspace
opacity. Heart size and pulmonary vascularity are normal. No
adenopathy. No bone lesions.
IMPRESSION: Apical scarring bilaterally. Lungs elsewhere clear. Heart size
normal. No evident adenopathy.

## 2023-02-09 ENCOUNTER — Ambulatory Visit: Payer: Managed Care, Other (non HMO) | Admitting: Family Medicine

## 2023-02-09 ENCOUNTER — Encounter: Payer: Self-pay | Admitting: Family Medicine

## 2023-02-09 ENCOUNTER — Other Ambulatory Visit: Payer: Self-pay | Admitting: Family Medicine

## 2023-02-09 VITALS — BP 146/79 | HR 76 | Temp 98.0°F | Ht 66.0 in | Wt 169.2 lb

## 2023-02-09 DIAGNOSIS — N952 Postmenopausal atrophic vaginitis: Secondary | ICD-10-CM

## 2023-02-09 DIAGNOSIS — Z720 Tobacco use: Secondary | ICD-10-CM | POA: Diagnosis not present

## 2023-02-09 DIAGNOSIS — R030 Elevated blood-pressure reading, without diagnosis of hypertension: Secondary | ICD-10-CM | POA: Diagnosis not present

## 2023-02-09 DIAGNOSIS — Z833 Family history of diabetes mellitus: Secondary | ICD-10-CM | POA: Diagnosis not present

## 2023-02-09 DIAGNOSIS — Z1211 Encounter for screening for malignant neoplasm of colon: Secondary | ICD-10-CM

## 2023-02-09 DIAGNOSIS — Z1159 Encounter for screening for other viral diseases: Secondary | ICD-10-CM

## 2023-02-09 DIAGNOSIS — Z8249 Family history of ischemic heart disease and other diseases of the circulatory system: Secondary | ICD-10-CM

## 2023-02-09 DIAGNOSIS — Z1231 Encounter for screening mammogram for malignant neoplasm of breast: Secondary | ICD-10-CM

## 2023-02-09 DIAGNOSIS — Z114 Encounter for screening for human immunodeficiency virus [HIV]: Secondary | ICD-10-CM

## 2023-02-09 DIAGNOSIS — N904 Leukoplakia of vulva: Secondary | ICD-10-CM | POA: Insufficient documentation

## 2023-02-09 LAB — URINALYSIS, ROUTINE W REFLEX MICROSCOPIC
Bilirubin, UA: NEGATIVE
Glucose, UA: NEGATIVE
Ketones, UA: NEGATIVE
Nitrite, UA: NEGATIVE
Protein,UA: NEGATIVE
Specific Gravity, UA: 1.01 (ref 1.005–1.030)
Urobilinogen, Ur: 0.2 mg/dL (ref 0.2–1.0)
pH, UA: 5.5 (ref 5.0–7.5)

## 2023-02-09 LAB — BAYER DCA HB A1C WAIVED: HB A1C (BAYER DCA - WAIVED): 5.6 % (ref 4.8–5.6)

## 2023-02-09 LAB — MICROALBUMIN, URINE WAIVED
Creatinine, Urine Waived: 50 mg/dL (ref 10–300)
Microalb, Ur Waived: 10 mg/L (ref 0–19)

## 2023-02-09 LAB — MICROSCOPIC EXAMINATION: Bacteria, UA: NONE SEEN

## 2023-02-09 MED ORDER — PREMARIN 0.625 MG/GM VA CREA: 1.0000 | TOPICAL_CREAM | Freq: Every day | VAGINAL | 1 refills | Status: DC

## 2023-02-09 NOTE — Assessment & Plan Note (Signed)
Severe. Will start premarin and recheck in a month. If not significantly better, will get her into GYN. Call with any concerns.

## 2023-02-09 NOTE — Assessment & Plan Note (Signed)
Labs drawn today. Await results.  

## 2023-02-09 NOTE — Progress Notes (Signed)
BP (!) 146/79 (BP Location: Left Arm, Cuff Size: Normal)   Pulse 76   Temp 98 F (36.7 C) (Oral)   Ht 5\' 6"  (1.676 m)   Wt 169 lb 3.2 oz (76.7 kg)   SpO2 97%   BMI 27.31 kg/m    Subjective:    Patient ID: Genella Mech, female    DOB: April 27, 1971, 52 y.o.   MRN: [id number]  HPI: ALLORAH WAI is a 52 y.o. female who presents today to establish care  Chief Complaint  Patient presents with   Establish Care   Vaginal Atrophy    Patient says she seen a GYN provider in the past. Patient was told she had cancer and then she had vaginal atrophy and was prescribed a steroid cream to use and says it made the issue worse. Patient says she was not happy with new GYN provider.    Was not happy with her care from her GYN. Has had itching and burning in her vaginal area for about 3 years. She has just been doing vasaline and some sort of vaginal itch cream.  She has not had any bleeding. She notes that she is having pretty severe discomfort with sex. No discharge. She is otherwise doing well with no other concerns or complaints at this time.    Active Ambulatory Problems    Diagnosis Date Noted   Tobacco use 08/21/2019   High risk for hip fracture 08/21/2019   Premature menopause 08/21/2019   Family history of breast cancer 08/21/2019   Vaginal atrophy 02/09/2023   Resolved Ambulatory Problems    Diagnosis Date Noted   No Resolved Ambulatory Problems   No Additional Past Medical History   Past Surgical History:  Procedure Laterality Date   NO PAST SURGERIES     Outpatient Encounter Medications as of 02/09/2023  Medication Sig   conjugated estrogens (PREMARIN) vaginal cream Place 1 Applicatorful vaginally daily. For 2 weeks, then try to decrease to 2-3x a week   [DISCONTINUED] clobetasol ointment (TEMOVATE) 0.05 % Apply to affected area every night for 4 weeks, then every other day for 4 weeks and then twice a week for 4 weeks or until resolution. (Patient not taking: Reported on  02/09/2023)   [DISCONTINUED] traMADol (ULTRAM) 50 MG tablet Take 1 tablet (50 mg total) by mouth every 6 (six) hours as needed. (Patient not taking: Reported on 02/09/2023)   No facility-administered encounter medications on file as of 02/09/2023.   No Known Allergies  Social History   Socioeconomic History   Marital status: Married    Spouse name: Not on file   Number of children: Not on file   Years of education: Not on file   Highest education level: Not on file  Occupational History   Not on file  Tobacco Use   Smoking status: Every Day    Packs/day: 0.33    Years: 36.00    Additional pack years: 0.00    Total pack years: 11.88    Types: Cigarettes    Start date: 1984   Smokeless tobacco: Never  Vaping Use   Vaping Use: Never used  Substance and Sexual Activity   Alcohol use: Not Currently   Drug use: Never   Sexual activity: Yes    Birth control/protection: Post-menopausal  Other Topics Concern   Not on file  Social History Narrative   Not on file   Social Determinants of Health   Financial Resource Strain: Not on file  Food Insecurity:  Not on file  Transportation Needs: Not on file  Physical Activity: Not on file  Stress: Not on file  Social Connections: Not on file   Family History  Problem Relation Age of Onset   Hypertension Mother    Hyperlipidemia Mother    Breast cancer Mother 67   Stroke Mother    Diabetes Father    Hypertension Father    Hyperlipidemia Father    Heart attack Father    Hypertension Sister    Hyperlipidemia Sister    Diabetes Sister    Kidney cancer Sister 9    Review of Systems  Constitutional: Negative.   Respiratory: Negative.    Cardiovascular: Negative.   Gastrointestinal: Negative.   Genitourinary:  Positive for vaginal discharge and vaginal pain. Negative for decreased urine volume, difficulty urinating, dyspareunia, dysuria, enuresis, flank pain, frequency, genital sores, hematuria, menstrual problem, pelvic pain,  urgency and vaginal bleeding.  Musculoskeletal: Negative.   Neurological: Negative.   Psychiatric/Behavioral: Negative.      Per HPI unless specifically indicated above     Objective:    BP (!) 146/79 (BP Location: Left Arm, Cuff Size: Normal)   Pulse 76   Temp 98 F (36.7 C) (Oral)   Ht 5\' 6"  (1.676 m)   Wt 169 lb 3.2 oz (76.7 kg)   SpO2 97%   BMI 27.31 kg/m   Wt Readings from Last 3 Encounters:  02/09/23 169 lb 3.2 oz (76.7 kg)  10/30/20 157 lb (71.2 kg)  08/21/19 154 lb (69.9 kg)    Physical Exam Vitals and nursing note reviewed. Exam conducted with a chaperone present.  Constitutional:      General: She is not in acute distress.    Appearance: Normal appearance. She is not ill-appearing, toxic-appearing or diaphoretic.  HENT:     Head: Normocephalic and atraumatic.     Right Ear: External ear normal.     Left Ear: External ear normal.     Nose: Nose normal.     Mouth/Throat:     Mouth: Mucous membranes are moist.     Pharynx: Oropharynx is clear.  Eyes:     General: No scleral icterus.       Right eye: No discharge.        Left eye: No discharge.     Extraocular Movements: Extraocular movements intact.     Conjunctiva/sclera: Conjunctivae normal.     Pupils: Pupils are equal, round, and reactive to light.  Cardiovascular:     Rate and Rhythm: Normal rate and regular rhythm.     Pulses: Normal pulses.     Heart sounds: Normal heart sounds. No murmur heard.    No friction rub. No gallop.  Pulmonary:     Effort: Pulmonary effort is normal. No respiratory distress.     Breath sounds: Normal breath sounds. No stridor. No wheezing, rhonchi or rales.  Chest:     Chest wall: No tenderness.  Genitourinary:    Comments: Hyperpigmentation around labia and perineum, significant driness and cracking Musculoskeletal:        General: Normal range of motion.     Cervical back: Normal range of motion and neck supple.  Skin:    General: Skin is warm and dry.      Capillary Refill: Capillary refill takes less than 2 seconds.     Coloration: Skin is not jaundiced or pale.     Findings: No bruising, erythema, lesion or rash.  Neurological:     General: No focal  deficit present.     Mental Status: She is alert and oriented to person, place, and time. Mental status is at baseline.  Psychiatric:        Mood and Affect: Mood normal.        Behavior: Behavior normal.        Thought Content: Thought content normal.        Judgment: Judgment normal.     Results for orders placed or performed in visit on 02/09/23  Microscopic Examination   Urine  Result Value Ref Range   WBC, UA 0-5 0 - 5 /hpf   RBC, Urine 0-2 0 - 2 /hpf   Epithelial Cells (non renal) 0-10 0 - 10 /hpf   Bacteria, UA None seen None seen/Few  Urinalysis, Routine w reflex microscopic  Result Value Ref Range   Specific Gravity, UA 1.010 1.005 - 1.030   pH, UA 5.5 5.0 - 7.5   Color, UA Yellow Yellow   Appearance Ur Cloudy (A) Clear   Leukocytes,UA 1+ (A) Negative   Protein,UA Negative Negative/Trace   Glucose, UA Negative Negative   Ketones, UA Negative Negative   RBC, UA Trace (A) Negative   Bilirubin, UA Negative Negative   Urobilinogen, Ur 0.2 0.2 - 1.0 mg/dL   Nitrite, UA Negative Negative   Microscopic Examination See below:   Bayer DCA Hb A1c Waived  Result Value Ref Range   HB A1C (BAYER DCA - WAIVED) 5.6 4.8 - 5.6 %  Microalbumin, Urine Waived  Result Value Ref Range   Microalb, Ur Waived 10 0 - 19 mg/L   Creatinine, Urine Waived 50 10 - 300 mg/dL   Microalb/Creat Ratio 30-300 (H) <30 mg/g      Assessment & Plan:   Problem List Items Addressed This Visit       Genitourinary   Vaginal atrophy - Primary    Severe. Will start premarin and recheck in a month. If not significantly better, will get her into GYN. Call with any concerns.         Other   Tobacco use    Labs drawn today. Await results.       Relevant Orders   Urinalysis, Routine w reflex  microscopic (Completed)   Other Visit Diagnoses     Elevated blood pressure reading       Will check labs and work on [employer]. Recheck 1 month. Call with any concerns.   Relevant Orders   CBC with Differential/Platelet   Comprehensive metabolic panel   TSH   Microalbumin, Urine Waived (Completed)   Family history of diabetes mellitus       Labs drawn today. Await results. Treat as needed.   Relevant Orders   Comprehensive metabolic panel   Bayer DCA Hb W1X Waived (Completed)   Family history of cardiovascular disease       Labs drawn today. Await results. Treat as needed.   Relevant Orders   Lipid Panel w/o Chol/HDL Ratio   Screening for HIV (human immunodeficiency virus)       Labs drawn today. Await results. Treat as needed.   Relevant Orders   HIV Antibody (routine testing w rflx)   Need for hepatitis C screening test       Labs drawn today. Await results. Treat as needed.   Relevant Orders   Hepatitis C Antibody   Encounter for screening mammogram for malignant neoplasm of breast       Mammogram scheduled today.   Relevant Orders  MM 3D SCREENING MAMMOGRAM BILATERAL BREAST   Screening for colon cancer       Cologuard ordered today.   Relevant Orders   Cologuard        Follow up plan: Return in about 4 weeks (around 03/09/2023), or follow up and physical, for records release GYN.

## 2023-02-09 NOTE — Patient Instructions (Signed)
Patient is scheduled for Mammogram on Wednesday June 12th at 9:20 AM at [facility]. If patient has any questions regarding her scheduled appointment. Patient may reach out to their facility directly at [phone].

## 2023-02-10 ENCOUNTER — Encounter: Payer: Self-pay | Admitting: Family Medicine

## 2023-02-10 DIAGNOSIS — E785 Hyperlipidemia, unspecified: Secondary | ICD-10-CM | POA: Insufficient documentation

## 2023-02-10 LAB — CBC WITH DIFFERENTIAL/PLATELET
Basophils Absolute: 0.1 10*3/uL (ref 0.0–0.2)
Basos: 1 %
EOS (ABSOLUTE): 0.1 10*3/uL (ref 0.0–0.4)
Eos: 2 %
Hematocrit: 42 % (ref 34.0–46.6)
Hemoglobin: 14 g/dL (ref 11.1–15.9)
Immature Grans (Abs): 0 10*3/uL (ref 0.0–0.1)
Immature Granulocytes: 1 %
Lymphocytes Absolute: 1.8 10*3/uL (ref 0.7–3.1)
Lymphs: 28 %
MCH: 29.5 pg (ref 26.6–33.0)
MCHC: 33.3 g/dL (ref 31.5–35.7)
MCV: 88 fL (ref 79–97)
Monocytes Absolute: 0.6 10*3/uL (ref 0.1–0.9)
Monocytes: 9 %
Neutrophils Absolute: 3.9 10*3/uL (ref 1.4–7.0)
Neutrophils: 59 %
Platelets: 291 10*3/uL (ref 150–450)
RBC: 4.75 x10E6/uL (ref 3.77–5.28)
RDW: 12.5 % (ref 11.7–15.4)
WBC: 6.5 10*3/uL (ref 3.4–10.8)

## 2023-02-10 LAB — COMPREHENSIVE METABOLIC PANEL
ALT: 26 IU/L (ref 0–32)
AST: 24 IU/L (ref 0–40)
Albumin/Globulin Ratio: 1.7 (ref 1.2–2.2)
Albumin: 4.5 g/dL (ref 3.8–4.9)
Alkaline Phosphatase: 114 IU/L (ref 44–121)
BUN/Creatinine Ratio: 11 (ref 9–23)
BUN: 9 mg/dL (ref 6–24)
Bilirubin Total: 1 mg/dL (ref 0.0–1.2)
CO2: 22 mmol/L (ref 20–29)
Calcium: 9.7 mg/dL (ref 8.7–10.2)
Chloride: 102 mmol/L (ref 96–106)
Creatinine, Ser: 0.84 mg/dL (ref 0.57–1.00)
Globulin, Total: 2.6 g/dL (ref 1.5–4.5)
Glucose: 87 mg/dL (ref 70–99)
Potassium: 3.8 mmol/L (ref 3.5–5.2)
Sodium: 141 mmol/L (ref 134–144)
Total Protein: 7.1 g/dL (ref 6.0–8.5)
eGFR: 83 mL/min/{1.73_m2} (ref >59)

## 2023-02-10 LAB — HEPATITIS C ANTIBODY: Hep C Virus Ab: NONREACTIVE

## 2023-02-10 LAB — LIPID PANEL W/O CHOL/HDL RATIO
Cholesterol, Total: 248 mg/dL — ABNORMAL HIGH (ref 100–199)
HDL: 50 mg/dL (ref >39)
LDL Chol Calc (NIH): 164 mg/dL — ABNORMAL HIGH (ref 0–99)
Triglycerides: 183 mg/dL — ABNORMAL HIGH (ref 0–149)
VLDL Cholesterol Cal: 34 mg/dL (ref 5–40)

## 2023-02-10 LAB — TSH: TSH: 1 u[IU]/mL (ref 0.450–4.500)

## 2023-02-10 LAB — HIV ANTIBODY (ROUTINE TESTING W REFLEX): HIV Screen 4th Generation wRfx: NONREACTIVE

## 2023-02-10 NOTE — Telephone Encounter (Signed)
Requested medication (s) are due for refill today:   Pharmacy comment: Alternative Requested    Requested medication (s) are on the active medication list: yes  Last refill:  02/09/23  Future visit scheduled: yes  Notes to clinic:    Pharmacy comment: Alternative Requested       Requested Prescriptions  Pending Prescriptions Disp Refills   estradiol (ESTRACE) 0.1 MG/GM vaginal cream [Pharmacy Med Name: ESTRADIOL 0.01% CREAM]  0     OB/GYN:  Estrogens Failed - 02/09/2023  1:49 PM      Failed - Mammogram is up-to-date per Health Maintenance      Failed - Last BP in normal range    BP Readings from Last 1 Encounters:  02/09/23 (!) 146/79         Passed - Valid encounter within last 12 months    Recent Outpatient Visits           Yesterday Vaginal atrophy   [facility], Oralia Rud, DO       Future Appointments             In 3 weeks Dorcas Carrow, DO  [facility]

## 2023-02-23 ENCOUNTER — Ambulatory Visit
Admission: RE | Admit: 2023-02-23 | Discharge: 2023-02-23 | Disposition: A | Discharge status: Home / Self Care | Payer: Managed Care, Other (non HMO) | Source: Ambulatory Visit | Attending: Family Medicine | Admitting: Family Medicine

## 2023-02-23 DIAGNOSIS — Z1231 Encounter for screening mammogram for malignant neoplasm of breast: Secondary | ICD-10-CM | POA: Diagnosis not present

## 2023-03-09 ENCOUNTER — Encounter: Payer: Self-pay | Admitting: Family Medicine

## 2023-03-09 ENCOUNTER — Ambulatory Visit: Payer: Managed Care, Other (non HMO) | Admitting: Family Medicine

## 2023-03-09 VITALS — BP 144/88 | HR 76 | Temp 97.6°F | Ht 66.0 in | Wt 167.0 lb

## 2023-03-09 DIAGNOSIS — Z23 Encounter for immunization: Secondary | ICD-10-CM | POA: Diagnosis not present

## 2023-03-09 DIAGNOSIS — Z0000 Encounter for general adult medical examination without abnormal findings: Secondary | ICD-10-CM

## 2023-03-09 MED ORDER — ESTRADIOL 0.1 MG/GM VA CREA: 1.0000 | TOPICAL_CREAM | VAGINAL | 4 refills | Status: DC

## 2023-03-09 NOTE — Progress Notes (Signed)
BP (!) 144/88   Pulse 76   Temp 97.6 F (36.4 C) (Oral)   Ht 5\' 6"  (1.676 m)   Wt 167 lb (75.8 kg)   SpO2 99%   BMI 26.95 kg/m    Subjective:    Patient ID: Michelle Paul, female    DOB: Mar 21, 1971, 52 y.o.   MRN: [id number]  HPI: Michelle Paul is a 52 y.o. female presenting on 03/09/2023 for comprehensive medical examination. Current medical complaints include:  Premarin is working great. Her vaginal opening is feeling much better. No concerns.   Menopausal Symptoms: yes  Depression Screen done today and results listed below:     03/09/2023    8:11 AM 02/09/2023   10:14 AM  Depression screen PHQ 2/9  Decreased Interest 0 0  Down, Depressed, Hopeless 0 0  PHQ - 2 Score 0 0  Altered sleeping 0 0  Tired, decreased energy 0 0  Change in appetite 0 0  Feeling bad or failure about yourself  0 0  Trouble concentrating 0 0  Moving slowly or fidgety/restless 0 0  Suicidal thoughts 0 0  PHQ-9 Score 0 0  Difficult doing work/chores Not difficult at all Not difficult at all    Past Medical History:  Past Medical History:  Diagnosis Date   Vaginal atrophy     Surgical History:  Past Surgical History:  Procedure Laterality Date   NO PAST SURGERIES      Medications:  No current outpatient medications on file prior to visit.   No current facility-administered medications on file prior to visit.    Allergies:  No Known Allergies  Social History:  Social History   Socioeconomic History   Marital status: Married    Spouse name: Not on file   Number of children: Not on file   Years of education: Not on file   Highest education level: Not on file  Occupational History   Not on file  Tobacco Use   Smoking status: Former    Packs/day: 0.33    Years: 36.00    Additional pack years: 0.00    Total pack years: 11.88    Types: Cigarettes    Start date: 1984   Smokeless tobacco: Never  Vaping Use   Vaping Use: Never used  Substance and Sexual Activity   Alcohol  use: Not Currently   Drug use: Never   Sexual activity: Yes    Birth control/protection: Post-menopausal  Other Topics Concern   Not on file  Social History Narrative   Not on file   Social Determinants of Health   Financial Resource Strain: Not on file  Food Insecurity: Not on file  Transportation Needs: Not on file  Physical Activity: Not on file  Stress: Not on file  Social Connections: Not on file  Intimate Partner Violence: Not on file   Social History   Tobacco Use  Smoking Status Former   Packs/day: 0.33   Years: 36.00   Additional pack years: 0.00   Total pack years: 11.88   Types: Cigarettes   Start date: 1984  Smokeless Tobacco Never   Social History   Substance and Sexual Activity  Alcohol Use Not Currently    Family History:  Family History  Problem Relation Age of Onset   Hypertension Mother    Hyperlipidemia Mother    Breast cancer Mother 38   Stroke Mother    Diabetes Father    Hypertension Father    Hyperlipidemia Father  Heart attack Father    Hypertension Sister    Hyperlipidemia Sister    Diabetes Sister    Kidney cancer Sister 71    Past medical history, surgical history, medications, allergies, family history and social history reviewed with patient today and changes made to appropriate areas of the chart.   Review of Systems  Constitutional: Negative.   HENT: Negative.    Eyes: Negative.   Respiratory: Negative.    Cardiovascular: Negative.   Gastrointestinal:  Positive for heartburn. Negative for abdominal pain, blood in stool, constipation, diarrhea, melena, nausea and vomiting.  Genitourinary: Negative.   Musculoskeletal: Negative.   Skin: Negative.   Neurological: Negative.   Endo/Heme/Allergies: Negative.   Psychiatric/Behavioral: Negative.     All other ROS negative except what is listed above and in the HPI.      Objective:    BP (!) 144/88   Pulse 76   Temp 97.6 F (36.4 C) (Oral)   Ht 5\' 6"  (1.676 m)   Wt  167 lb (75.8 kg)   SpO2 99%   BMI 26.95 kg/m   Wt Readings from Last 3 Encounters:  03/09/23 167 lb (75.8 kg)  02/09/23 169 lb 3.2 oz (76.7 kg)  10/30/20 157 lb (71.2 kg)    Physical Exam Vitals and nursing note reviewed.  Constitutional:      General: She is not in acute distress.    Appearance: Normal appearance. She is normal weight. She is not ill-appearing, toxic-appearing or diaphoretic.  HENT:     Head: Normocephalic and atraumatic.     Right Ear: Tympanic membrane, ear canal and external ear normal. There is no impacted cerumen.     Left Ear: Tympanic membrane, ear canal and external ear normal. There is no impacted cerumen.     Nose: Nose normal. No congestion or rhinorrhea.     Mouth/Throat:     Mouth: Mucous membranes are moist.     Pharynx: Oropharynx is clear. No oropharyngeal exudate or posterior oropharyngeal erythema.  Eyes:     General: No scleral icterus.       Right eye: No discharge.        Left eye: No discharge.     Extraocular Movements: Extraocular movements intact.     Conjunctiva/sclera: Conjunctivae normal.     Pupils: Pupils are equal, round, and reactive to light.  Neck:     Vascular: No carotid bruit.  Cardiovascular:     Rate and Rhythm: Normal rate and regular rhythm.     Pulses: Normal pulses.     Heart sounds: No murmur heard.    No friction rub. No gallop.  Pulmonary:     Effort: Pulmonary effort is normal. No respiratory distress.     Breath sounds: Normal breath sounds. No stridor. No wheezing, rhonchi or rales.  Chest:     Chest wall: No tenderness.  Abdominal:     General: Abdomen is flat. Bowel sounds are normal. There is no distension.     Palpations: Abdomen is soft. There is no mass.     Tenderness: There is no abdominal tenderness. There is no right CVA tenderness, left CVA tenderness, guarding or rebound.     Hernia: No hernia is present.  Genitourinary:    Comments: Breast and pelvic exams deferred with shared decision  making Musculoskeletal:        General: No swelling, tenderness, deformity or signs of injury.     Cervical back: Normal range of motion and neck supple. No  rigidity. No muscular tenderness.     Right lower leg: No edema.     Left lower leg: No edema.  Lymphadenopathy:     Cervical: No cervical adenopathy.  Skin:    General: Skin is warm and dry.     Capillary Refill: Capillary refill takes less than 2 seconds.     Coloration: Skin is not jaundiced or pale.     Findings: No bruising, erythema, lesion or rash.  Neurological:     General: No focal deficit present.     Mental Status: She is alert and oriented to person, place, and time. Mental status is at baseline.     Cranial Nerves: No cranial nerve deficit.     Sensory: No sensory deficit.     Motor: No weakness.     Coordination: Coordination normal.     Gait: Gait normal.     Deep Tendon Reflexes: Reflexes normal.  Psychiatric:        Mood and Affect: Mood normal.        Behavior: Behavior normal.        Thought Content: Thought content normal.        Judgment: Judgment normal.     Results for orders placed or performed in visit on 02/09/23  Microscopic Examination   Urine  Result Value Ref Range   WBC, UA 0-5 0 - 5 /hpf   RBC, Urine 0-2 0 - 2 /hpf   Epithelial Cells (non renal) 0-10 0 - 10 /hpf   Bacteria, UA None seen None seen/Few  CBC with Differential/Platelet  Result Value Ref Range   WBC 6.5 3.4 - 10.8 x10E3/uL   RBC 4.75 3.77 - 5.28 x10E6/uL   Hemoglobin 14.0 11.1 - 15.9 g/dL   Hematocrit 95.6 21.3 - 46.6 %   MCV 88 79 - 97 fL   MCH 29.5 26.6 - 33.0 pg   MCHC 33.3 31.5 - 35.7 g/dL   RDW 08.6 57.8 - 46.9 %   Platelets 291 150 - 450 x10E3/uL   Neutrophils 59 Not Estab. %   Lymphs 28 Not Estab. %   Monocytes 9 Not Estab. %   Eos 2 Not Estab. %   Basos 1 Not Estab. %   Neutrophils Absolute 3.9 1.4 - 7.0 x10E3/uL   Lymphocytes Absolute 1.8 0.7 - 3.1 x10E3/uL   Monocytes Absolute 0.6 0.1 - 0.9 x10E3/uL    EOS (ABSOLUTE) 0.1 0.0 - 0.4 x10E3/uL   Basophils Absolute 0.1 0.0 - 0.2 x10E3/uL   Immature Granulocytes 1 Not Estab. %   Immature Grans (Abs) 0.0 0.0 - 0.1 x10E3/uL  Comprehensive metabolic panel  Result Value Ref Range   Glucose 87 70 - 99 mg/dL   BUN 9 6 - 24 mg/dL   Creatinine, Ser 6.29 0.57 - 1.00 mg/dL   eGFR 83 >52 WU/XLK/4.40   BUN/Creatinine Ratio 11 9 - 23   Sodium 141 134 - 144 mmol/L   Potassium 3.8 3.5 - 5.2 mmol/L   Chloride 102 96 - 106 mmol/L   CO2 22 20 - 29 mmol/L   Calcium 9.7 8.7 - 10.2 mg/dL   Total Protein 7.1 6.0 - 8.5 g/dL   Albumin 4.5 3.8 - 4.9 g/dL   Globulin, Total 2.6 1.5 - 4.5 g/dL   Albumin/Globulin Ratio 1.7 1.2 - 2.2   Bilirubin Total 1.0 0.0 - 1.2 mg/dL   Alkaline Phosphatase 114 44 - 121 IU/L   AST 24 0 - 40 IU/L   ALT 26 0 -  32 IU/L  Lipid Panel w/o Chol/HDL Ratio  Result Value Ref Range   Cholesterol, Total 248 (H) 100 - 199 mg/dL   Triglycerides 782 (H) 0 - 149 mg/dL   HDL 50 >95 mg/dL   VLDL Cholesterol Cal 34 5 - 40 mg/dL   LDL Chol Calc (NIH) 621 (H) 0 - 99 mg/dL  Urinalysis, Routine w reflex microscopic  Result Value Ref Range   Specific Gravity, UA 1.010 1.005 - 1.030   pH, UA 5.5 5.0 - 7.5   Color, UA Yellow Yellow   Appearance Ur Cloudy (A) Clear   Leukocytes,UA 1+ (A) Negative   Protein,UA Negative Negative/Trace   Glucose, UA Negative Negative   Ketones, UA Negative Negative   RBC, UA Trace (A) Negative   Bilirubin, UA Negative Negative   Urobilinogen, Ur 0.2 0.2 - 1.0 mg/dL   Nitrite, UA Negative Negative   Microscopic Examination See below:   TSH  Result Value Ref Range   TSH 1.000 0.450 - 4.500 uIU/mL  Bayer DCA Hb A1c Waived  Result Value Ref Range   HB A1C (BAYER DCA - WAIVED) 5.6 4.8 - 5.6 %  HIV Antibody (routine testing w rflx)  Result Value Ref Range   HIV Screen 4th Generation wRfx Non Reactive Non Reactive  Hepatitis C Antibody  Result Value Ref Range   Hep C Virus Ab Non Reactive Non Reactive   Microalbumin, Urine Waived  Result Value Ref Range   Microalb, Ur Waived 10 0 - 19 mg/L   Creatinine, Urine Waived 50 10 - 300 mg/dL   Microalb/Creat Ratio 30-300 (H) <30 mg/g      Assessment & Plan:   Problem List Items Addressed This Visit   None Visit Diagnoses     Routine general medical examination at a health care facility    -  Primary   Vaccines up to date/declined. Screening labs checked last visit. Pap and mammo up to date. Cologuard at home. Continue diet and exercise. Call with any concerns   Need for Tdap vaccination            Follow up plan: Return in about 6 months (around 09/08/2023).   LABORATORY TESTING:  - Pap smear: up to date  IMMUNIZATIONS:   - Tdap: Tetanus vaccination status reviewed: Tdap vaccination indicated and given today. - Influenza: Postponed to flu season - Pneumovax: Not applicable - Prevnar: Not applicable - COVID: Refused - HPV: Not applicable - Shingrix vaccine: Refused  SCREENING: -Mammogram: Up to date  - Colonoscopy: will do cologuard- has it at home   PATIENT COUNSELING:   Advised to take 1 mg of folate supplement per day if capable of pregnancy.   Sexuality: Discussed sexually transmitted diseases, partner selection, use of condoms, avoidance of unintended pregnancy  and contraceptive alternatives.   Advised to avoid cigarette smoking.  I discussed with the patient that most people either abstain from alcohol or drink within safe limits (<=14/week and <=4 drinks/occasion for males, <=7/weeks and <= 3 drinks/occasion for females) and that the risk for alcohol disorders and other health effects rises proportionally with the number of drinks per week and how often a drinker exceeds daily limits.  Discussed cessation/primary prevention of drug use and availability of treatment for abuse.   Diet: Encouraged to adjust caloric intake to maintain  or achieve ideal body weight, to reduce intake of dietary saturated fat and total  fat, to limit sodium intake by avoiding high sodium foods and not adding table  salt, and to maintain adequate dietary potassium and calcium preferably from fresh fruits, vegetables, and low-fat dairy products.    stressed the importance of regular exercise  Injury prevention: Discussed safety belts, safety helmets, smoke detector, smoking near bedding or upholstery.   Dental health: Discussed importance of regular tooth brushing, flossing, and dental visits.    NEXT PREVENTATIVE PHYSICAL DUE IN 1 YEAR. Return in about 6 months (around 09/08/2023).

## 2023-03-23 LAB — COLOGUARD: COLOGUARD: NEGATIVE

## 2023-03-23 NOTE — Progress Notes (Signed)
Contacted via MyChart   Cologuard negative, repeat in 3 years.

## 2023-08-22 ENCOUNTER — Encounter: Payer: Self-pay | Admitting: Family Medicine

## 2023-08-22 NOTE — Telephone Encounter (Signed)
appt

## 2023-08-22 NOTE — Telephone Encounter (Signed)
Attempted to reach patient, LVM to call office back if she wants to be seen prior to her January appointment.  Put in CRM.

## 2023-08-22 NOTE — Telephone Encounter (Signed)
OK to wait until her appt in January unless she'd like to get it done sooner

## 2023-09-02 ENCOUNTER — Telehealth: Payer: Managed Care, Other (non HMO) | Admitting: Physician Assistant

## 2023-09-02 ENCOUNTER — Encounter: Payer: Self-pay | Admitting: Family Medicine

## 2023-09-02 DIAGNOSIS — J208 Acute bronchitis due to other specified organisms: Secondary | ICD-10-CM | POA: Diagnosis not present

## 2023-09-02 MED ORDER — BENZONATATE 100 MG PO CAPS
100.0000 mg | ORAL_CAPSULE | Freq: Three times a day (TID) | ORAL | 0 refills | Status: DC | PRN
Start: 2023-09-02 — End: 2023-10-04

## 2023-09-02 MED ORDER — ALBUTEROL SULFATE HFA 108 (90 BASE) MCG/ACT IN AERS
1.0000 | INHALATION_SPRAY | Freq: Four times a day (QID) | RESPIRATORY_TRACT | 0 refills | Status: DC | PRN
Start: 2023-09-02 — End: 2023-10-04

## 2023-09-02 NOTE — Progress Notes (Signed)
E-Visit for Cough   We are sorry that you are not feeling well.  Here is how we plan to help!  Based on your presentation I believe you most likely have A cough due to a virus.  This is called viral bronchitis and is best treated by rest, plenty of fluids and control of the cough.  You may use Ibuprofen or Tylenol as directed to help your symptoms.     In addition you may use A non-prescription cough medication called Mucinex DM: take 2 tablets every 12 hours. and A prescription cough medication called Tessalon Perles 100mg . You may take 1-2 capsules every 8 hours as needed for your cough.  I have also prescribed Albuterol inhaler Use 1-2 puffs every 6 hours as needed for shortness of breath, chest tightness, and/or wheezing.  From your responses in the eVisit questionnaire you describe inflammation in the upper respiratory tract which is causing a significant cough.  This is commonly called Bronchitis and has four common causes:   Allergies Viral Infections Acid Reflux Bacterial Infection Allergies, viruses and acid reflux are treated by controlling symptoms or eliminating the cause. An example might be a cough caused by taking certain blood pressure medications. You stop the cough by changing the medication. Another example might be a cough caused by acid reflux. Controlling the reflux helps control the cough.  USE OF BRONCHODILATOR ("RESCUE") INHALERS: There is a risk from using your bronchodilator too frequently.  The risk is that over-reliance on a medication which only relaxes the muscles surrounding the breathing tubes can reduce the effectiveness of medications prescribed to reduce swelling and congestion of the tubes themselves.  Although you feel brief relief from the bronchodilator inhaler, your asthma may actually be worsening with the tubes becoming more swollen and filled with mucus.  This can delay other crucial treatments, such as oral steroid medications. If you need to use a  bronchodilator inhaler daily, several times per day, you should discuss this with your provider.  There are probably better treatments that could be used to keep your asthma under control.     HOME CARE Only take medications as instructed by your medical team. Complete the entire course of an antibiotic. Drink plenty of fluids and get plenty of rest. Avoid close contacts especially the very young and the elderly Cover your mouth if you cough or cough into your sleeve. Always remember to wash your hands A steam or ultrasonic humidifier can help congestion.   GET HELP RIGHT AWAY IF: You develop worsening fever. You become short of breath You cough up blood. Your symptoms persist after you have completed your treatment plan MAKE SURE YOU  Understand these instructions. Will watch your condition. Will get help right away if you are not doing well or get worse.    Thank you for choosing an e-visit.  Your e-visit answers were reviewed by a board certified advanced clinical practitioner to complete your personal care plan. Depending upon the condition, your plan could have included both over the counter or prescription medications.  Please review your pharmacy choice. Make sure the pharmacy is open so you can pick up prescription now. If there is a problem, you may contact your provider through [employer] and have the prescription routed to another pharmacy.  Your safety is important to [place]. If you have drug allergies check your prescription carefully.   For the next 24 hours you can use MyChart to ask questions about today's visit, request a non-urgent call  back, or ask for a work or school excuse. You will get an email in the next two days asking about your experience. I hope that your e-visit has been valuable and will speed your recovery.   I have spent 5 minutes in review of e-visit questionnaire, review and updating patient chart, medical decision making and response to patient.    Margaretann Loveless, PA-C

## 2023-09-02 NOTE — Telephone Encounter (Signed)
appt

## 2023-09-05 NOTE — Telephone Encounter (Signed)
Attempted to reach patient to get scheduled for an appointment, LVM to call office back to get scheduled.  Put in CRM.

## 2023-09-12 ENCOUNTER — Ambulatory Visit: Payer: Self-pay

## 2023-09-12 NOTE — Telephone Encounter (Signed)
  Chief Complaint: URI Symptoms: cough with congestion, runny nose, sore throat, body aches, low grade fever Frequency: 09/07/23 Pertinent Negatives: Patient denies SOB Disposition: [] ED /[] Urgent Care (no appt availability in office) / [x] Appointment(In office/virtual)/ []  [facility]/ [] Home Care/ [] Refused Recommended Disposition /[] [facility]/ []  Follow-up with PCP Additional Notes: pt states she had cough on 09/01/23, got better after few days and then was around daughter on Christmas and got sick again. Pt hasn't been taking anything OTC for sx. Scheduled OV tomorrow at 2:20 with Clydie Braun, NP. Care advice given and pt verbalized understanding.   Summary: Cold/Flu symptoms, seeking nurse advice   Pt has relapsed cold/flu symptoms, wants to discuss treatment advice with a nurse.       Reason for Disposition  [1] Nasal discharge AND [2] present > 10 days  Answer Assessment - Initial Assessment Questions 1. ONSET: "When did the nasal discharge start?"      09/07/23 2. AMOUNT: "How much discharge is there?"      Moderate  3. COUGH: "Do you have a cough?" If Yes, ask: "Describe the color of your sputum" (clear, white, yellow, green)     Yes with congestion  4. RESPIRATORY DISTRESS: "Describe your breathing."      Normal  5. FEVER: "Do you have a fever?" If Yes, ask: "What is your temperature, how was it measured, and when did it start?"     Low grade  6. SEVERITY: "Overall, how bad are you feeling right now?" (e.g., doesn't interfere with normal activities, staying home from school/work, staying in bed)      Got better and then got worse  7. OTHER SYMPTOMS: "Do you have any other symptoms?" (e.g., sore throat, earache, wheezing, vomiting)     Sore throat, runny nose, body aches,  Protocols used: Common Cold-A-AH

## 2023-09-13 ENCOUNTER — Ambulatory Visit: Payer: Managed Care, Other (non HMO) | Admitting: Nurse Practitioner

## 2023-09-13 NOTE — Progress Notes (Deleted)
 There were no vitals taken for this visit.   Subjective:    Patient ID: Michelle Paul, female    DOB: 17-Feb-1971, 52 y.o.   MRN: [id number]  HPI: Michelle Paul is a 52 y.o. female  No chief complaint on file.  UPPER RESPIRATORY TRACT INFECTION Worst symptom: Fever: {Blank single:19197::yes,no} Cough: {Blank single:19197::yes,no} Shortness of breath: {Blank single:19197::yes,no} Wheezing: {Blank single:19197::yes,no} Chest pain: {Blank single:19197::yes,no,yes, with cough} Chest tightness: {Blank single:19197::yes,no} Chest congestion: {Blank single:19197::yes,no} Nasal congestion: {Blank single:19197::yes,no} Runny nose: {Blank single:19197::yes,no} Post nasal drip: {Blank single:19197::yes,no} Sneezing: {Blank single:19197::yes,no} Sore throat: {Blank single:19197::yes,no} Swollen glands: {Blank single:19197::yes,no} Sinus pressure: {Blank single:19197::yes,no} Headache: {Blank single:19197::yes,no} Face pain: {Blank single:19197::yes,no} Toothache: {Blank single:19197::yes,no} Ear pain: {Blank single:19197::yes,no} {Blank single:19197::right,left, bilateral} Ear pressure: {Blank single:19197::yes,no} {Blank single:19197::right,left, bilateral} Eyes red/itching:{Blank single:19197::yes,no} Eye drainage/crusting: {Blank single:19197::yes,no}  Vomiting: {Blank single:19197::yes,no} Rash: {Blank single:19197::yes,no} Fatigue: {Blank single:19197::yes,no} Sick contacts: {Blank single:19197::yes,no} Strep contacts: {Blank single:19197::yes,no}  Context: {Blank multiple:19196::better,worse,stable,fluctuating} Recurrent sinusitis: {Blank single:19197::yes,no} Relief with OTC cold/cough medications: {Blank single:19197::yes,no}  Treatments attempted: {Blank multiple:19196::none,cold/sinus,mucinex,anti-histamine,pseudoephedrine,cough  syrup,antibiotics}   Relevant past medical, surgical, family and social history reviewed and updated as indicated. Interim medical history since our last visit reviewed. Allergies and medications reviewed and updated.  Review of Systems  Per HPI unless specifically indicated above     Objective:    There were no vitals taken for this visit.  Wt Readings from Last 3 Encounters:  03/09/23 167 lb (75.8 kg)  02/09/23 169 lb 3.2 oz (76.7 kg)  10/30/20 157 lb (71.2 kg)    Physical Exam  Results for orders placed or performed in visit on 02/09/23  Microscopic Examination   Collection Time: 02/09/23 10:44 AM   Urine  Result Value Ref Range   WBC, UA 0-5 0 - 5 /hpf   RBC, Urine 0-2 0 - 2 /hpf   Epithelial Cells (non renal) 0-10 0 - 10 /hpf   Bacteria, UA None seen None seen/Few  Urinalysis, Routine w reflex microscopic   Collection Time: 02/09/23 10:44 AM  Result Value Ref Range   Specific Gravity, UA 1.010 1.005 - 1.030   pH, UA 5.5 5.0 - 7.5   Color, UA Yellow Yellow   Appearance Ur Cloudy (A) Clear   Leukocytes,UA 1+ (A) Negative   Protein,UA Negative Negative/Trace   Glucose, UA Negative Negative   Ketones, UA Negative Negative   RBC, UA Trace (A) Negative   Bilirubin, UA Negative Negative   Urobilinogen, Ur 0.2 0.2 - 1.0 mg/dL   Nitrite, UA Negative Negative   Microscopic Examination See below:   Bayer DCA Hb A1c Waived   Collection Time: 02/09/23 10:44 AM  Result Value Ref Range   HB A1C (BAYER DCA - WAIVED) 5.6 4.8 - 5.6 %  Microalbumin, Urine Waived   Collection Time: 02/09/23 10:44 AM  Result Value Ref Range   Microalb, Ur Waived 10 0 - 19 mg/L   Creatinine, Urine Waived 50 10 - 300 mg/dL   Microalb/Creat Ratio 30-300 (H) <30 mg/g  CBC with Differential/Platelet   Collection Time: 02/09/23 10:48 AM  Result Value Ref Range   WBC 6.5 3.4 - 10.8 x10E3/uL   RBC 4.75 3.77 - 5.28 x10E6/uL   Hemoglobin 14.0 11.1 - 15.9 g/dL   Hematocrit 57.9 65.9 - 46.6 %    MCV 88 79 - 97 fL   MCH 29.5 26.6 - 33.0 pg   MCHC 33.3 31.5 - 35.7 g/dL   RDW 87.4 88.2 - 84.5 %  Platelets 291 150 - 450 x10E3/uL   Neutrophils 59 Not Estab. %   Lymphs 28 Not Estab. %   Monocytes 9 Not Estab. %   Eos 2 Not Estab. %   Basos 1 Not Estab. %   Neutrophils Absolute 3.9 1.4 - 7.0 x10E3/uL   Lymphocytes Absolute 1.8 0.7 - 3.1 x10E3/uL   Monocytes Absolute 0.6 0.1 - 0.9 x10E3/uL   EOS (ABSOLUTE) 0.1 0.0 - 0.4 x10E3/uL   Basophils Absolute 0.1 0.0 - 0.2 x10E3/uL   Immature Granulocytes 1 Not Estab. %   Immature Grans (Abs) 0.0 0.0 - 0.1 x10E3/uL  Comprehensive metabolic panel   Collection Time: 02/09/23 10:48 AM  Result Value Ref Range   Glucose 87 70 - 99 mg/dL   BUN 9 6 - 24 mg/dL   Creatinine, Ser 9.15 0.57 - 1.00 mg/dL   eGFR 83 >40 fO/fpw/8.26   BUN/Creatinine Ratio 11 9 - 23   Sodium 141 134 - 144 mmol/L   Potassium 3.8 3.5 - 5.2 mmol/L   Chloride 102 96 - 106 mmol/L   CO2 22 20 - 29 mmol/L   Calcium 9.7 8.7 - 10.2 mg/dL   Total Protein 7.1 6.0 - 8.5 g/dL   Albumin 4.5 3.8 - 4.9 g/dL   Globulin, Total 2.6 1.5 - 4.5 g/dL   Albumin/Globulin Ratio 1.7 1.2 - 2.2   Bilirubin Total 1.0 0.0 - 1.2 mg/dL   Alkaline Phosphatase 114 44 - 121 IU/L   AST 24 0 - 40 IU/L   ALT 26 0 - 32 IU/L  Lipid Panel w/o Chol/HDL Ratio   Collection Time: 02/09/23 10:48 AM  Result Value Ref Range   Cholesterol, Total 248 (H) 100 - 199 mg/dL   Triglycerides 816 (H) 0 - 149 mg/dL   HDL 50 >60 mg/dL   VLDL Cholesterol Cal 34 5 - 40 mg/dL   LDL Chol Calc (NIH) 835 (H) 0 - 99 mg/dL  TSH   Collection Time: 02/09/23 10:48 AM  Result Value Ref Range   TSH 1.000 0.450 - 4.500 uIU/mL  HIV Antibody (routine testing w rflx)   Collection Time: 02/09/23 10:48 AM  Result Value Ref Range   HIV Screen 4th Generation wRfx Non Reactive Non Reactive  Hepatitis C Antibody   Collection Time: 02/09/23 10:48 AM  Result Value Ref Range   Hep C Virus Ab Non Reactive Non Reactive  Cologuard    Collection Time: 03/18/23  9:25 AM  Result Value Ref Range   COLOGUARD Negative Negative      Assessment & Plan:   Problem List Items Addressed This Visit   None    Follow up plan: No follow-ups on file.

## 2023-09-15 ENCOUNTER — Ambulatory Visit: Payer: Managed Care, Other (non HMO) | Admitting: Family Medicine

## 2023-10-04 ENCOUNTER — Encounter: Payer: Self-pay | Admitting: Family Medicine

## 2023-10-04 ENCOUNTER — Ambulatory Visit: Payer: Managed Care, Other (non HMO) | Admitting: Family Medicine

## 2023-10-04 VITALS — BP 139/80 | HR 78 | Temp 98.1°F | Wt 164.4 lb

## 2023-10-04 DIAGNOSIS — R030 Elevated blood-pressure reading, without diagnosis of hypertension: Secondary | ICD-10-CM

## 2023-10-04 DIAGNOSIS — E782 Mixed hyperlipidemia: Secondary | ICD-10-CM | POA: Diagnosis not present

## 2023-10-04 MED ORDER — ESTRADIOL 0.1 MG/GM VA CREA: 1.0000 | TOPICAL_CREAM | VAGINAL | 4 refills | Status: AC

## 2023-10-04 NOTE — Assessment & Plan Note (Signed)
Rechecking labs today. Await results. Treat as needed. Call with any concerns.  

## 2023-10-04 NOTE — Progress Notes (Signed)
BP 139/80   Pulse 78   Temp 98.1 F (36.7 C) (Oral)   Wt 164 lb 6.4 oz (74.6 kg)   SpO2 99%   BMI 26.53 kg/m    Subjective:    Patient ID: Michelle Paul, female    DOB: 03-Feb-1971, 53 y.o.   MRN: [id number]  HPI: Michelle Paul is a 53 y.o. female  Chief Complaint  Patient presents with   Hyperlipidemia   Dental Problem    Patient says she went for a Dental Procedure back in November and her blood pressure was elevated and procedure was pushed. Patient says she was informed she would need a letter stating that her blood pressure elevation was medically induced.    HYPERLIPIDEMIA Hyperlipidemia status: stable Satisfied with current treatment?  Not on anything Past cholesterol meds: none Supplements: none Aspirin:  no The 10-year ASCVD risk score (Arnett DK, et al., 2019) is: 3.2%   Values used to calculate the score:     Age: 9 years     Sex: Female     Is Non-Hispanic African American: No     Diabetic: No     Tobacco smoker: No     Systolic Blood Pressure: 139 mmHg     Is BP treated: No     HDL Cholesterol: 50 mg/dL     Total Cholesterol: 248 mg/dL Chest pain:  no Coronary artery disease:  no Family history CAD:  yes  ELEVATED BLOOD PRESSURE Duration of elevated BP:  just when she go to the dentist BP monitoring frequency: daily BP range: 110s-120s/70s Previous BP meds: no Recent stressors: no Family history of hypertension: yes Recurrent headaches: no Visual changes: no Palpitations: no  Dyspnea: no Chest pain: no Lower extremity edema: no Dizzy/lightheaded: no Transient ischemic attacks: no  Relevant past medical, surgical, family and social history reviewed and updated as indicated. Interim medical history since our last visit reviewed. Allergies and medications reviewed and updated.  Review of Systems  Constitutional: Negative.   Respiratory: Negative.    Cardiovascular: Negative.   Musculoskeletal: Negative.   Neurological: Negative.    Psychiatric/Behavioral: Negative.      Per HPI unless specifically indicated above     Objective:    BP 139/80   Pulse 78   Temp 98.1 F (36.7 C) (Oral)   Wt 164 lb 6.4 oz (74.6 kg)   SpO2 99%   BMI 26.53 kg/m   Wt Readings from Last 3 Encounters:  10/04/23 164 lb 6.4 oz (74.6 kg)  03/09/23 167 lb (75.8 kg)  02/09/23 169 lb 3.2 oz (76.7 kg)    Physical Exam Vitals and nursing note reviewed.  Constitutional:      General: She is not in acute distress.    Appearance: Normal appearance. She is not ill-appearing, toxic-appearing or diaphoretic.  HENT:     Head: Normocephalic and atraumatic.     Right Ear: External ear normal.     Left Ear: External ear normal.     Nose: Nose normal.     Mouth/Throat:     Mouth: Mucous membranes are moist.     Pharynx: Oropharynx is clear.  Eyes:     General: No scleral icterus.       Right eye: No discharge.        Left eye: No discharge.     Extraocular Movements: Extraocular movements intact.     Conjunctiva/sclera: Conjunctivae normal.     Pupils: Pupils are equal, round, and reactive to  light.  Cardiovascular:     Rate and Rhythm: Normal rate and regular rhythm.     Pulses: Normal pulses.     Heart sounds: Normal heart sounds. No murmur heard.    No friction rub. No gallop.  Pulmonary:     Effort: Pulmonary effort is normal. No respiratory distress.     Breath sounds: Normal breath sounds. No stridor. No wheezing, rhonchi or rales.  Chest:     Chest wall: No tenderness.  Musculoskeletal:        General: Normal range of motion.     Cervical back: Normal range of motion and neck supple.  Skin:    General: Skin is warm and dry.     Capillary Refill: Capillary refill takes less than 2 seconds.     Coloration: Skin is not jaundiced or pale.     Findings: No bruising, erythema, lesion or rash.  Neurological:     General: No focal deficit present.     Mental Status: She is alert and oriented to person, place, and time. Mental  status is at baseline.  Psychiatric:        Mood and Affect: Mood normal.        Behavior: Behavior normal.        Thought Content: Thought content normal.        Judgment: Judgment normal.     Results for orders placed or performed in visit on 02/09/23  Microscopic Examination   Collection Time: 02/09/23 10:44 AM   Urine  Result Value Ref Range   WBC, UA 0-5 0 - 5 /hpf   RBC, Urine 0-2 0 - 2 /hpf   Epithelial Cells (non renal) 0-10 0 - 10 /hpf   Bacteria, UA None seen None seen/Few  Urinalysis, Routine w reflex microscopic   Collection Time: 02/09/23 10:44 AM  Result Value Ref Range   Specific Gravity, UA 1.010 1.005 - 1.030   pH, UA 5.5 5.0 - 7.5   Color, UA Yellow Yellow   Appearance Ur Cloudy (A) Clear   Leukocytes,UA 1+ (A) Negative   Protein,UA Negative Negative/Trace   Glucose, UA Negative Negative   Ketones, UA Negative Negative   RBC, UA Trace (A) Negative   Bilirubin, UA Negative Negative   Urobilinogen, Ur 0.2 0.2 - 1.0 mg/dL   Nitrite, UA Negative Negative   Microscopic Examination See below:   Bayer DCA Hb A1c Waived   Collection Time: 02/09/23 10:44 AM  Result Value Ref Range   HB A1C (BAYER DCA - WAIVED) 5.6 4.8 - 5.6 %  Microalbumin, Urine Waived   Collection Time: 02/09/23 10:44 AM  Result Value Ref Range   Microalb, Ur Waived 10 0 - 19 mg/L   Creatinine, Urine Waived 50 10 - 300 mg/dL   Microalb/Creat Ratio 30-300 (H) <30 mg/g  CBC with Differential/Platelet   Collection Time: 02/09/23 10:48 AM  Result Value Ref Range   WBC 6.5 3.4 - 10.8 x10E3/uL   RBC 4.75 3.77 - 5.28 x10E6/uL   Hemoglobin 14.0 11.1 - 15.9 g/dL   Hematocrit 13.0 86.5 - 46.6 %   MCV 88 79 - 97 fL   MCH 29.5 26.6 - 33.0 pg   MCHC 33.3 31.5 - 35.7 g/dL   RDW 78.4 69.6 - 29.5 %   Platelets 291 150 - 450 x10E3/uL   Neutrophils 59 Not Estab. %   Lymphs 28 Not Estab. %   Monocytes 9 Not Estab. %   Eos 2 Not Estab. %  Basos 1 Not Estab. %   Neutrophils Absolute 3.9 1.4 - 7.0  x10E3/uL   Lymphocytes Absolute 1.8 0.7 - 3.1 x10E3/uL   Monocytes Absolute 0.6 0.1 - 0.9 x10E3/uL   EOS (ABSOLUTE) 0.1 0.0 - 0.4 x10E3/uL   Basophils Absolute 0.1 0.0 - 0.2 x10E3/uL   Immature Granulocytes 1 Not Estab. %   Immature Grans (Abs) 0.0 0.0 - 0.1 x10E3/uL  Comprehensive metabolic panel   Collection Time: 02/09/23 10:48 AM  Result Value Ref Range   Glucose 87 70 - 99 mg/dL   BUN 9 6 - 24 mg/dL   Creatinine, Ser 2.95 0.57 - 1.00 mg/dL   eGFR 83 >28 UX/LKG/4.01   BUN/Creatinine Ratio 11 9 - 23   Sodium 141 134 - 144 mmol/L   Potassium 3.8 3.5 - 5.2 mmol/L   Chloride 102 96 - 106 mmol/L   CO2 22 20 - 29 mmol/L   Calcium 9.7 8.7 - 10.2 mg/dL   Total Protein 7.1 6.0 - 8.5 g/dL   Albumin 4.5 3.8 - 4.9 g/dL   Globulin, Total 2.6 1.5 - 4.5 g/dL   Albumin/Globulin Ratio 1.7 1.2 - 2.2   Bilirubin Total 1.0 0.0 - 1.2 mg/dL   Alkaline Phosphatase 114 44 - 121 IU/L   AST 24 0 - 40 IU/L   ALT 26 0 - 32 IU/L  Lipid Panel w/o Chol/HDL Ratio   Collection Time: 02/09/23 10:48 AM  Result Value Ref Range   Cholesterol, Total 248 (H) 100 - 199 mg/dL   Triglycerides 027 (H) 0 - 149 mg/dL   HDL 50 >25 mg/dL   VLDL Cholesterol Cal 34 5 - 40 mg/dL   LDL Chol Calc (NIH) 366 (H) 0 - 99 mg/dL  TSH   Collection Time: 02/09/23 10:48 AM  Result Value Ref Range   TSH 1.000 0.450 - 4.500 uIU/mL  HIV Antibody (routine testing w rflx)   Collection Time: 02/09/23 10:48 AM  Result Value Ref Range   HIV Screen 4th Generation wRfx Non Reactive Non Reactive  Hepatitis C Antibody   Collection Time: 02/09/23 10:48 AM  Result Value Ref Range   Hep C Virus Ab Non Reactive Non Reactive  Cologuard   Collection Time: 03/18/23  9:25 AM  Result Value Ref Range   COLOGUARD Negative Negative      Assessment & Plan:   Problem List Items Addressed This Visit       Other   Hyperlipidemia - Primary   Rechecking labs today. Await results. Treat as needed. Call with any concerns.       Relevant  Orders   Lipid Panel w/o Chol/HDL Ratio   Comprehensive metabolic panel   Other Visit Diagnoses       Elevated blood pressure reading       Under good control today. Continue DASH diet and monitoring at home. Call with any concerns.        Follow up plan: Return in about 6 months (around 04/02/2024) for physical.

## 2023-10-05 ENCOUNTER — Encounter: Payer: Self-pay | Admitting: Family Medicine

## 2023-10-05 LAB — COMPREHENSIVE METABOLIC PANEL
ALT: 21 [IU]/L (ref 0–32)
AST: 24 [IU]/L (ref 0–40)
Albumin: 4.3 g/dL (ref 3.8–4.9)
Alkaline Phosphatase: 123 [IU]/L — ABNORMAL HIGH (ref 44–121)
BUN/Creatinine Ratio: 12 (ref 9–23)
BUN: 10 mg/dL (ref 6–24)
Bilirubin Total: 1.1 mg/dL (ref 0.0–1.2)
CO2: 23 mmol/L (ref 20–29)
Calcium: 9.7 mg/dL (ref 8.7–10.2)
Chloride: 98 mmol/L (ref 96–106)
Creatinine, Ser: 0.83 mg/dL (ref 0.57–1.00)
Globulin, Total: 2.6 g/dL (ref 1.5–4.5)
Glucose: 80 mg/dL (ref 70–99)
Potassium: 4 mmol/L (ref 3.5–5.2)
Sodium: 137 mmol/L (ref 134–144)
Total Protein: 6.9 g/dL (ref 6.0–8.5)
eGFR: 83 mL/min/{1.73_m2} (ref >59)

## 2023-10-05 LAB — LIPID PANEL W/O CHOL/HDL RATIO
Cholesterol, Total: 230 mg/dL — ABNORMAL HIGH (ref 100–199)
HDL: 56 mg/dL (ref >39)
LDL Chol Calc (NIH): 144 mg/dL — ABNORMAL HIGH (ref 0–99)
Triglycerides: 166 mg/dL — ABNORMAL HIGH (ref 0–149)
VLDL Cholesterol Cal: 30 mg/dL (ref 5–40)

## 2024-03-05 ENCOUNTER — Encounter: Payer: Self-pay | Admitting: Family Medicine

## 2024-04-06 ENCOUNTER — Encounter: Admitting: Family Medicine

## 2024-04-10 ENCOUNTER — Encounter: Payer: Self-pay | Admitting: Family Medicine

## 2024-04-17 ENCOUNTER — Ambulatory Visit: Payer: Self-pay | Admitting: *Deleted

## 2024-04-17 NOTE — Telephone Encounter (Signed)
 Copied from CRM [id number]. Topic: Clinical - Red Word Triage >> Apr 17, 2024  8:57 AM Wess RAMAN wrote: Red Word that prompted transfer to Nurse Triage: Fever of 101, dull Pain in stomach, severe diarrhea Reason for Disposition  [1] SEVERE diarrhea (e.g., 7 or more times / day more than normal) AND [2] present > 24 hours (1 day)  Answer Assessment - Initial Assessment Questions 1. DIARRHEA SEVERITY: How bad is the diarrhea? How many more stools have you had in the past 24 hours than normal?      I'm having diarrhea, fever 101, It's 99.4 now.   I'm having terrible diarrhea.   I don't think I can come in today.    2. ONSET: When did the diarrhea begin?      Stomach pain Sun night.   Diarrhea started yesterday.    Fever started last night.    3. STOOL DESCRIPTION:  How loose or watery is the diarrhea? What is the stool color? Is there any blood or mucous in the stool?     No blood.      It's watery 4. VOMITING: Are you also vomiting? If Yes, ask: How many times in the past 24 hours?      I had nausea no vomiting 5. ABDOMEN PAIN: Are you having any abdomen pain? If Yes, ask: What does it feel like? (e.g., crampy, dull, intermittent, constant)      Yes    It almost felt like I pulled something on the right side.    6. ABDOMEN PAIN SEVERITY: If present, ask: How bad is the pain?  (e.g., Scale 1-10; mild, moderate, or severe)     Yes 7. ORAL INTAKE: If vomiting, Have you been able to drink liquids? How much liquids have you had in the past 24 hours?     I'm drinking water.   When I eat it's going straight through me.    I have not tried any OTC medication. 8. HYDRATION: Any signs of dehydration? (e.g., dry mouth [not just dry lips], too weak to stand, dizziness, new weight loss) When did you last urinate?     No 9. EXPOSURE: Have you traveled to a foreign country recently? Have you been exposed to anyone with diarrhea? Could you have eaten any food that was  spoiled?     No 10. ANTIBIOTIC USE: Are you taking antibiotics now or have you taken antibiotics in the past 2 months?       No 11. OTHER SYMPTOMS: Do you have any other symptoms? (e.g., fever, blood in stool)       Abd pain fever, diarrhea 12. PREGNANCY: Is there any chance you are pregnant? When was your last menstrual period?       N/A due to age  Protocols used: Diarrhea-A-AH  Pt decided she would prefer to try some Imodium before scheduling an appt.   I'm having so much diarrhea I don't think I could come in anyway.    She was receptive to a virtual visit but decided she would like to try the Imodium first since she had not taken anything OTC for the diarrhea.   If it does not help she will call back in.   I went over the home care advice with her.   She was agreeable to this plan.         FYI Only or Action Required?: FYI only for provider.  Patient was last seen in primary care on 10/04/2023 by Vicci,  Megan P, DO.  Called Nurse Triage reporting Diarrhea. Having diarrhea for the last 2 days with abd pain.   Had fever 101 last night.   Had not taken any OTC medications.   Decided to try Imodium before scheduling an appt.     Symptoms began yesterday. 2 days ago.  Interventions attempted: Nothing.  Symptoms are: gradually worsening fever, abd pain and diarrhea 6-8 watery stools a day..  Triage Disposition: Home Care Pt will try Imodium first then call back if it does not help.   Went over the home care advice.    Patient/caregiver understands and will follow disposition?: Yes

## 2024-04-17 NOTE — Telephone Encounter (Signed)
 Patient should be seen in an UC if she is having diarrhea with a fever.

## 2024-04-18 NOTE — Telephone Encounter (Signed)
 Ok for E2C2 to review.  Please advise patient we likely have no openings but if she is not feeling better she needs to be seen in urgent care.
